# Patient Record
Sex: Female | Born: 1955 | Race: White | Hispanic: No | Smoking: Former smoker
Health system: Southern US, Community
[De-identification: ages and names within clinical notes are randomized; demographics above are authoritative.]

## PROBLEM LIST (undated history)

## (undated) DIAGNOSIS — E039 Hypothyroidism, unspecified: Secondary | ICD-10-CM

## (undated) DIAGNOSIS — C801 Malignant (primary) neoplasm, unspecified: Secondary | ICD-10-CM

## (undated) DIAGNOSIS — M1990 Unspecified osteoarthritis, unspecified site: Secondary | ICD-10-CM

## (undated) HISTORY — PX: LAPAROSCOPIC OOPHERECTOMY: SHX6507

## (undated) HISTORY — PX: BREAST SURGERY: SHX581

## (undated) HISTORY — DX: Unspecified osteoarthritis, unspecified site: M19.90

## (undated) HISTORY — DX: Malignant (primary) neoplasm, unspecified: C80.1

## (undated) HISTORY — PX: KNEE ARTHROSCOPY: SUR90

---

## 2014-08-23 ENCOUNTER — Ambulatory Visit: Payer: Self-pay | Admitting: Family Medicine

## 2014-11-29 ENCOUNTER — Ambulatory Visit (INDEPENDENT_AMBULATORY_CARE_PROVIDER_SITE_OTHER): Payer: PRIVATE HEALTH INSURANCE

## 2014-11-29 ENCOUNTER — Ambulatory Visit (INDEPENDENT_AMBULATORY_CARE_PROVIDER_SITE_OTHER): Payer: PRIVATE HEALTH INSURANCE | Admitting: Emergency Medicine

## 2014-11-29 VITALS — BP 112/72 | HR 63 | Temp 98.9°F | Resp 16 | Ht 70.25 in | Wt 161.4 lb

## 2014-11-29 DIAGNOSIS — R059 Cough, unspecified: Secondary | ICD-10-CM

## 2014-11-29 DIAGNOSIS — J209 Acute bronchitis, unspecified: Secondary | ICD-10-CM

## 2014-11-29 DIAGNOSIS — J0140 Acute pansinusitis, unspecified: Secondary | ICD-10-CM | POA: Diagnosis not present

## 2014-11-29 DIAGNOSIS — R05 Cough: Secondary | ICD-10-CM | POA: Diagnosis not present

## 2014-11-29 DIAGNOSIS — E039 Hypothyroidism, unspecified: Secondary | ICD-10-CM | POA: Insufficient documentation

## 2014-11-29 MED ORDER — HYDROCOD POLST-CPM POLST ER 10-8 MG/5ML PO SUER: 5.0000 mL | Freq: Two times a day (BID) | ORAL | Status: DC

## 2014-11-29 MED ORDER — PSEUDOEPHEDRINE-GUAIFENESIN ER 60-600 MG PO TB12: 1.0000 | ORAL_TABLET | Freq: Two times a day (BID) | ORAL | Status: DC

## 2014-11-29 NOTE — Patient Instructions (Signed)

## 2014-11-29 NOTE — Progress Notes (Signed)
Subjective:  Patient ID: Brandi Mosley, female    DOB: 1956/01/31  Age: 59 y.o. 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 She has a cough and exposed a number of clients with "walking pneumonia" she is concerned that she is contracted pneumonia as well. She has a cough that's productive of scant sputum is worse at night when she lays down. She has some nasal congestion and postnasal drainage but denies any history of sinus infection or allergies. She has no fever or chills. She has no wheezing or shortness of breath. She has no nausea or vomiting. She is requesting a chest x-ray. She has no improvement with over-the-counter medication.  History Brandi Mosley has a past medical history of Cancer and Osteoarthritis.   She has past surgical history that includes Breast surgery; Laparoscopic oopherectomy; and Knee arthroscopy (Bilateral).   Her  family history includes Cancer in her father, maternal grandmother, mother, and sister; Hyperlipidemia in her father; Hypertension in her father.  She   reports that she has never smoked. She does not have any smokeless tobacco history on file. She reports that she drinks alcohol. She reports that she does not use illicit drugs.  No outpatient prescriptions prior to visit.   No facility-administered medications prior to visit.    History   Social History  . Marital Status: Single    Spouse Name: N/A  . Number of Children: N/A  . Years of Education: N/A   Social History Main Topics  . Smoking status: Never Smoker   . Smokeless tobacco: Not on file  . Alcohol Use: 0.0 oz/week    0 Standard drinks or equivalent per week  . Drug Use: No  . Sexual Activity: Not on file   Other Topics Concern  . None   Social History Narrative  . None     Review of Systems  Constitutional: Positive for fatigue. Negative for fever,  chills and appetite change.  HENT: Positive for congestion and sinus pressure. Negative for ear pain, postnasal drip and sore throat.   Eyes: Negative for pain and redness.  Respiratory: Positive for cough. Negative for shortness of breath and wheezing.   Cardiovascular: Negative for leg swelling.  Gastrointestinal: Negative for nausea, vomiting, abdominal pain, diarrhea, constipation and blood in stool.  Endocrine: Negative for polyuria.  Genitourinary: Negative for dysuria, urgency, frequency and flank pain.  Musculoskeletal: Negative for gait problem.  Skin: Negative for rash.  Neurological: Negative for weakness and headaches.  Psychiatric/Behavioral: Negative for confusion and decreased concentration. The patient is not nervous/anxious.     Objective:  BP 112/72 mmHg  Pulse 63  Temp(Src) 98.9 F (37.2 C) (Oral)  Resp 16  Ht 5' 10.25" (1.784 m)  Wt 161 lb 6.4 oz (73.211 kg)  BMI 23.00 kg/m2  SpO2 97%  Physical Exam  Constitutional: She is oriented to person, place, and time. She appears well-developed and well-nourished. No distress.  HENT:  Head: Normocephalic and atraumatic.  Right Ear: External ear normal.  Left Ear: External ear normal.  Nose: Nose normal.  Eyes: Conjunctivae and EOM are normal. Pupils are equal, round, and reactive to light. No scleral icterus.  Neck: Normal range of motion. Neck supple. No tracheal deviation present.  Cardiovascular: Normal rate, regular rhythm and normal heart sounds.   Pulmonary/Chest: Effort normal. No respiratory distress. She has no wheezes. She has no rales.  Abdominal: She  exhibits no mass. There is no tenderness. There is no rebound and no guarding.  Musculoskeletal: She exhibits no edema.  Lymphadenopathy:    She has no cervical adenopathy.  Neurological: She is alert and oriented to person, place, and time. Coordination normal.  Skin: Skin is warm and dry. No rash noted.  Psychiatric: She has a normal mood and affect. Her  behavior is normal.      Assessment & Plan:   Brandi Mosley was seen today for chest congestion, cough, shortness of breath, fatigue and dizziness.  Diagnoses and all orders for this visit:  Acute bronchitis, unspecified organism  Hypothyroidism, unspecified hypothyroidism type  Cough Orders: -     DG Chest 2 View; Future  Acute pansinusitis, recurrence not specified  Other orders -     pseudoephedrine-guaifenesin (MUCINEX D) 60-600 MG per tablet; Take 1 tablet by mouth every 12 (twelve) hours. -     chlorpheniramine-HYDROcodone (TUSSIONEX PENNKINETIC ER) 10-8 MG/5ML SUER; Take 5 mLs by mouth 2 (two) times daily.   I am having Brandi Mosley start on pseudoephedrine-guaifenesin and chlorpheniramine-HYDROcodone. I am also having her maintain her levothyroxine.  Meds ordered this encounter  Medications  . levothyroxine (SYNTHROID, LEVOTHROID) 175 MCG tablet    Sig: Take 175 mcg by mouth daily before breakfast.  . pseudoephedrine-guaifenesin (MUCINEX D) 60-600 MG per tablet    Sig: Take 1 tablet by mouth every 12 (twelve) hours.    Dispense:  18 tablet    Refill:  0  . chlorpheniramine-HYDROcodone (TUSSIONEX PENNKINETIC ER) 10-8 MG/5ML SUER    Sig: Take 5 mLs by mouth 2 (two) times daily.    Dispense:  60 mL    Refill:  0    Appropriate red flag conditions were discussed with the patient as well as actions that should be taken.  Patient expressed his understanding.  Follow-up: Return if symptoms worsen or fail to improve.  Roselee Culver, MD   [facility] reading (PRIMARY) by  Dr. Ouida Sills.  Negative other than bilateral mastectomy.

## 2015-04-04 ENCOUNTER — Ambulatory Visit: Payer: Self-pay | Admitting: Orthopedic Surgery

## 2015-04-04 NOTE — Progress Notes (Signed)
Preoperative surgical orders have been place into the Epic hospital system for Brandi Mosley on 04/04/2015, 4:42 PM  by Mickel Crow for surgery on 04-21-2015.  Preop Total Knee orders including Experal, IV Tylenol, and IV Decadron as long as there are no contraindications to the above medications. Arlee Muslim, PA-C

## 2015-04-14 NOTE — Patient Instructions (Signed)
Tayanna Talford  04/14/2015   Your procedure is scheduled on: 04/21/2015    Report to [facility] Main  Entrance take [facility]  elevators to 3rd floor to  [place] at    1050 AM.  Call this number if you have problems the morning of surgery [phone]   Remember: ONLY 1 PERSON MAY GO WITH YOU TO SHORT STAY TO GET  READY MORNING OF [place].  Do not eat food after midnite.  May have clear liquids from 12 midnite until 0700am morning of surgery then nothing by mouth.       Take these medicines the morning of surgery with A SIP OF WATER:                                 You may not have any metal on your body including hair pins and              piercings  Do not wear jewelry, make-up, lotions, powders or perfumes, deodorant             Do not wear nail polish.  Do not shave  48 hours prior to surgery.                Do not bring valuables to the hospital. [place].  Contacts, dentures or bridgework may not be worn into surgery.  Leave suitcase in the car. After surgery it may be brought to your room.         Special Instructions: coughing and deep breathing exercises, leg exercises               Please read over the following fact sheets you were given: _____________________________________________________________________                CLEAR LIQUID DIET   Foods Allowed                                                                     Foods Excluded  Coffee and tea, regular and decaf                             liquids that you cannot  Plain Jell-O in any flavor                                             see through such as: Fruit ices (not with fruit pulp)                                     milk, soups, orange juice  Iced Popsicles  All solid food Carbonated beverages, regular and diet                                    Cranberry, grape and apple  juices Sports drinks like Gatorade Lightly seasoned clear broth or consume(fat free) Sugar, honey syrup  Sample Menu Breakfast                                Lunch                                     Supper Cranberry juice                    Beef broth                            Chicken broth Jell-O                                     Grape juice                           Apple juice Coffee or tea                        Jell-O                                      Popsicle                                                Coffee or tea                        Coffee or tea  _____________________________________________________________________  [facility] - Preparing for Surgery Before surgery, you can play an important role.  Because skin is not sterile, your skin needs to be as free of germs as possible.  You can reduce the number of germs on your skin by washing with CHG (chlorahexidine gluconate) soap before surgery.  CHG is an antiseptic cleaner which kills germs and bonds with the skin to continue killing germs even after washing. Please DO NOT use if you have an allergy to CHG or antibacterial soaps.  If your skin becomes reddened/irritated stop using the CHG and inform your nurse when you arrive at Short Stay. Do not shave (including legs and underarms) for at least 48 hours prior to the first CHG shower.  You may shave your face/neck. Please follow these instructions carefully:  1.  Shower with CHG Soap the night before surgery and the  morning of Surgery.  2.  If you choose to wash your hair, wash your hair first as usual with your  normal  shampoo.  3.  After you shampoo, rinse your hair and body thoroughly to remove the  shampoo.  4.  Use CHG as you would any other liquid soap.  You can apply chg directly  to the skin and wash                       Gently with a scrungie or clean washcloth.  5.  Apply the CHG Soap to your body ONLY FROM THE NECK DOWN.   Do not  use on face/ open                           Wound or open sores. Avoid contact with eyes, ears mouth and genitals (private parts).                       Wash face,  Genitals (private parts) with your normal soap.             6.  Wash thoroughly, paying special attention to the area where your surgery  will be performed.  7.  Thoroughly rinse your body with warm water from the neck down.  8.  DO NOT shower/wash with your normal soap after using and rinsing off  the CHG Soap.                9.  Pat yourself dry with a clean towel.            10.  Wear clean pajamas.            11.  Place clean sheets on your bed the night of your first shower and do not  sleep with pets. Day of Surgery : Do not apply any lotions/deodorants the morning of surgery.  Please wear clean clothes to the hospital/surgery center.  FAILURE TO FOLLOW THESE INSTRUCTIONS MAY RESULT IN THE CANCELLATION OF YOUR SURGERY PATIENT SIGNATURE_________________________________  NURSE SIGNATURE__________________________________  ________________________________________________________________________  WHAT IS A BLOOD TRANSFUSION? Blood Transfusion Information  A transfusion is the replacement of blood or some of its parts. Blood is made up of multiple cells which provide different functions.  Red blood cells carry oxygen and are used for blood loss replacement.  White blood cells fight against infection.  Platelets control bleeding.  Plasma helps clot blood.  Other blood products are available for specialized needs, such as hemophilia or other clotting disorders. BEFORE THE TRANSFUSION  Who gives blood for transfusions?   Healthy volunteers who are fully evaluated to make sure their blood is safe. This is blood bank blood. Transfusion therapy is the safest it has ever been in the practice of medicine. Before blood is taken from a donor, a complete history is taken to make sure that person has no history of diseases nor  engages in risky social behavior (examples are intravenous drug use or sexual activity with multiple partners). The donor's travel history is screened to minimize risk of transmitting infections, such as malaria. The donated blood is tested for signs of infectious diseases, such as HIV and hepatitis. The blood is then tested to be sure it is compatible with you in order to minimize the chance of a transfusion reaction. If you or a relative donates blood, this is often done in anticipation of surgery and is not appropriate for emergency situations. It takes many days to process the donated blood. RISKS AND COMPLICATIONS Although transfusion therapy is very safe and saves many lives, the main dangers of transfusion include:  1. Getting an infectious disease. 2. Developing a transfusion reaction.  This is an allergic reaction to something in the blood you were given. Every precaution is taken to prevent this. The decision to have a blood transfusion has been considered carefully by your caregiver before blood is given. Blood is not given unless the benefits outweigh the risks. AFTER THE TRANSFUSION  Right after receiving a blood transfusion, you will usually feel much better and more energetic. This is especially true if your red blood cells have gotten low (anemic). The transfusion raises the level of the red blood cells which carry oxygen, and this usually causes an energy increase.  The nurse administering the transfusion will monitor you carefully for complications. HOME CARE INSTRUCTIONS  No special instructions are needed after a transfusion. You may find your energy is better. Speak with your caregiver about any limitations on activity for underlying diseases you may have. SEEK MEDICAL CARE IF:   Your condition is not improving after your transfusion.  You develop redness or irritation at the intravenous (IV) site. SEEK IMMEDIATE MEDICAL CARE IF:  Any of the following symptoms occur over the  next 12 hours:  Shaking chills.  You have a temperature by mouth above 102 F (38.9 C), not controlled by medicine.  Chest, back, or muscle pain.  People around you feel you are not acting correctly or are confused.  Shortness of breath or difficulty breathing.  Dizziness and fainting.  You get a rash or develop hives.  You have a decrease in urine output.  Your urine turns a dark color or changes to pink, red, or brown. Any of the following symptoms occur over the next 10 days:  You have a temperature by mouth above 102 F (38.9 C), not controlled by medicine.  Shortness of breath.  Weakness after normal activity.  The white part of the eye turns yellow (jaundice).  You have a decrease in the amount of urine or are urinating less often.  Your urine turns a dark color or changes to pink, red, or brown. Document Released: 05/21/2000 Document Revised: 08/16/2011 Document Reviewed: 01/08/2008 ExitCare Patient Information 2014 [place].  _______________________________________________________________________  Incentive Spirometer  An incentive spirometer is a tool that can help keep your lungs clear and active. This tool measures how well you are filling your lungs with each breath. Taking long deep breaths may help reverse or decrease the chance of developing breathing (pulmonary) problems (especially infection) following:  A long period of time when you are unable to move or be active. BEFORE THE PROCEDURE   If the spirometer includes an indicator to show your best effort, your nurse or respiratory therapist will set it to a desired goal.  If possible, sit up straight or lean slightly forward. Try not to slouch.  Hold the incentive spirometer in an upright position. INSTRUCTIONS FOR USE  3. Sit on the edge of your bed if possible, or sit up as far as you can in bed or on a chair. 4. Hold the incentive spirometer in an upright position. 5. Breathe out  normally. 6. Place the mouthpiece in your mouth and seal your lips tightly around it. 7. Breathe in slowly and as deeply as possible, raising the piston or the ball toward the top of the column. 8. Hold your breath for 3-5 seconds or for as long as possible. Allow the piston or ball to fall to the bottom of the column. 9. Remove the mouthpiece from your mouth and breathe out normally. 10. Rest for a few seconds and repeat Steps  1 through 7 at least 10 times every 1-2 hours when you are awake. Take your time and take a few normal breaths between deep breaths. 11. The spirometer may include an indicator to show your best effort. Use the indicator as a goal to work toward during each repetition. 12. After each set of 10 deep breaths, practice coughing to be sure your lungs are clear. If you have an incision (the cut made at the time of surgery), support your incision when coughing by placing a pillow or rolled up towels firmly against it. Once you are able to get out of bed, walk around indoors and cough well. You may stop using the incentive spirometer when instructed by your caregiver.  RISKS AND COMPLICATIONS  Take your time so you do not get dizzy or light-headed.  If you are in pain, you may need to take or ask for pain medication before doing incentive spirometry. It is harder to take a deep breath if you are having pain. AFTER USE  Rest and breathe slowly and easily.  It can be helpful to keep track of a log of your progress. Your caregiver can provide you with a simple table to help with this. If you are using the spirometer at home, follow these instructions: [place] IF:   You are having difficultly using the spirometer.  You have trouble using the spirometer as often as instructed.  Your pain medication is not giving enough relief while using the spirometer.  You develop fever of 100.5 F (38.1 C) or higher. SEEK IMMEDIATE MEDICAL CARE IF:   You cough up bloody sputum  that had not been present before.  You develop fever of 102 F (38.9 C) or greater.  You develop worsening pain at or near the incision site. MAKE SURE YOU:   Understand these instructions.  Will watch your condition.  Will get help right away if you are not doing well or get worse. Document Released: 10/04/2006 Document Revised: 08/16/2011 Document Reviewed: 12/05/2006 [facility] Patient Information 2014 [place], [place].   ________________________________________________________________________

## 2015-04-15 ENCOUNTER — Encounter (HOSPITAL_COMMUNITY)
Admission: RE | Admit: 2015-04-15 | Discharge: 2015-04-15 | Disposition: A | Discharge status: Home / Self Care | Payer: No Typology Code available for payment source | Source: Ambulatory Visit | Attending: Orthopedic Surgery | Admitting: Orthopedic Surgery

## 2015-04-15 ENCOUNTER — Encounter (HOSPITAL_COMMUNITY): Payer: Self-pay

## 2015-04-15 DIAGNOSIS — M179 Osteoarthritis of knee, unspecified: Secondary | ICD-10-CM | POA: Insufficient documentation

## 2015-04-15 DIAGNOSIS — Z01818 Encounter for other preprocedural examination: Secondary | ICD-10-CM | POA: Insufficient documentation

## 2015-04-15 HISTORY — DX: Hypothyroidism, unspecified: E03.9

## 2015-04-15 LAB — COMPREHENSIVE METABOLIC PANEL
ALT: 24 U/L (ref 14–54)
AST: 30 U/L (ref 15–41)
Albumin: 4.6 g/dL (ref 3.5–5.0)
Alkaline Phosphatase: 52 U/L (ref 38–126)
Anion gap: 8 (ref 5–15)
BILIRUBIN TOTAL: 0.7 mg/dL (ref 0.3–1.2)
BUN: 16 mg/dL (ref 6–20)
CALCIUM: 9.7 mg/dL (ref 8.9–10.3)
CHLORIDE: 101 mmol/L (ref 101–111)
CO2: 30 mmol/L (ref 22–32)
CREATININE: 0.78 mg/dL (ref 0.44–1.00)
Glucose, Bld: 117 mg/dL — ABNORMAL HIGH (ref 65–99)
Potassium: 4.1 mmol/L (ref 3.5–5.1)
Sodium: 139 mmol/L (ref 135–145)
TOTAL PROTEIN: 7.2 g/dL (ref 6.5–8.1)

## 2015-04-15 LAB — URINALYSIS, ROUTINE W REFLEX MICROSCOPIC
Bilirubin Urine: NEGATIVE
Glucose, UA: NEGATIVE mg/dL
Hgb urine dipstick: NEGATIVE
Ketones, ur: NEGATIVE mg/dL
LEUKOCYTES UA: NEGATIVE
NITRITE: NEGATIVE
PH: 7 (ref 5.0–8.0)
Protein, ur: NEGATIVE mg/dL
SPECIFIC GRAVITY, URINE: 1.008 (ref 1.005–1.030)
UROBILINOGEN UA: 0.2 mg/dL (ref 0.0–1.0)

## 2015-04-15 LAB — CBC
HCT: 43.7 % (ref 36.0–46.0)
Hemoglobin: 14.3 g/dL (ref 12.0–15.0)
MCH: 31.8 pg (ref 26.0–34.0)
MCHC: 32.7 g/dL (ref 30.0–36.0)
MCV: 97.3 fL (ref 78.0–100.0)
PLATELETS: 299 10*3/uL (ref 150–400)
RBC: 4.49 MIL/uL (ref 3.87–5.11)
RDW: 12.9 % (ref 11.5–15.5)
WBC: 6.3 10*3/uL (ref 4.0–10.5)

## 2015-04-15 LAB — SURGICAL PCR SCREEN
MRSA, PCR: NEGATIVE
STAPHYLOCOCCUS AUREUS: NEGATIVE

## 2015-04-15 LAB — APTT: APTT: 27 s (ref 24–37)

## 2015-04-15 LAB — ABO/RH: ABO/RH(D): A NEG

## 2015-04-15 LAB — PROTIME-INR
INR: 1 (ref 0.00–1.49)
PROTHROMBIN TIME: 13.4 s (ref 11.6–15.2)

## 2015-04-15 NOTE — Progress Notes (Signed)
Clearance- Dr Kelton Pillar 04/07/2015 on chart along with LOV.  CXR- 11/29/2014-EPIC.

## 2015-04-20 ENCOUNTER — Ambulatory Visit: Payer: Self-pay | Admitting: Orthopedic Surgery

## 2015-04-20 NOTE — H&P (Signed)
Brandi Dine MD DOB: Aug 30, 1955 Divorced / Language: Cleophus Molt / Race: White Female Date of Admission:  04/21/2015 CC:  Right Knee Pain History of Present Illness  The patient is a 59 year old female who comes in for a preoperative History and Physical. The patient is scheduled for a right total knee arthroplasty to be performed by Dr. Dione Plover. Aluisio, MD at [facility] on 04-21-2015. The patient reports right knee symptoms including: pain (lateral side pain), instability, giving way and popping (with warmth and tenderness.) which began year(s) ago without any known injury. The patient describes the severity of the symptoms as moderate in severity. The patient describes their pain as sharp.The patient feels that the symptoms are worsening. The patient has the current diagnosis of knee osteoarthritis. Previous work-up for this problem has included knee x-rays and knee MRI. Past treatment for this problem has included intra-articular injection of corticosteroids. Current treatment includes application of ice and knee brace. Note for "Knee pain": 1976 pt. had bilateral knee scope. Pt. does not believe in pain medicine Unfortunately, the right knee is getting progressively worse over time. She is currently working as a Transport planner, helping veterans. She says it is getting hard for her to do her occupational activities as well as activities of daily living. She was scheduled for total knee with Dr. Noemi Chapel later in November, but per conversations with many friends has requested for me to treat her. She was seen in consultation and felt to be a good candidate for the knee replacement. The knee hurts it at all times, it limits her what she can and cannot do. She has had injections in the past without benefit. Sje has elected to proceed with surgery at this time. They have been treated conservatively in the past for the above stated problem and despite conservative measures, they continue to have  progressive pain and severe functional limitations and dysfunction. They have failed non-operative management including home exercise, medications, and injections. It is felt that they would benefit from undergoing total joint replacement. Risks and benefits of the procedure have been discussed with the patient and they elect to proceed with surgery. There are no active contraindications to surgery such as ongoing infection or rapidly progressive neurological disease.  Problem List/Past Medical Primary osteoarthritis of right knee (M17.11) Hypothyroidism Chronic Pain Osteoarthritis Breast Cancer Hemorrhoids Shingles Past History  Allergies No Known Drug Allergies  Family History Cancer Father, Maternal Grandmother, Mother, Sister. Osteoarthritis Father, Sister. Hypertension Father. Osteoporosis Father, Sister. Depression Maternal Grandmother, Mother, Sister. Cerebrovascular Accident Mother. Heart Disease Father. Drug / Alcohol Addiction Brother, Father.  Social History Current drinker 04/03/2015: Currently drinks beer and wine less than 5 times per week Children 0 Exercise Exercises weekly; does running / walking Current work status working full time Living situation live alone Tobacco use Former smoker. 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 She lives alone and will see how she does following total knee replacement.  Medication History Synthroid (175MCG Tablet, Oral) Active.   Past Surgical History Mastectomy - Bilateral Date: 2008. bilateral Hemorrhoidectomy Tonsillectomy Date: 43. Arthroscopy of Knee Date: 1978. bilateral Anal Fissure Repair Dilation and  Curettage of Uterus - Multiple Breast Biopsy right Oophorectomy, Bilateral Date: 2009.  Review of Systems  General Present- Fatigue. Not Present-  Chills, Fever, Memory Loss, Night Sweats, Weight Gain and Weight Loss. Skin Not Present- Eczema, Hives, Itching, Lesions and Rash. HEENT Not Present- Dentures, Double Vision, Headache, Hearing Loss, Tinnitus and Visual Loss. Respiratory Not Present- Allergies, Chronic Cough, Coughing up blood, Shortness of breath at rest and Shortness of breath with exertion. Cardiovascular Not Present- Chest Pain, Difficulty Breathing Lying Down, Murmur, Palpitations, Racing/skipping heartbeats and Swelling. Gastrointestinal Not Present- Abdominal Pain, Bloody Stool, Constipation, Diarrhea, Difficulty Swallowing, Heartburn, Jaundice, Loss of appetitie, Nausea and Vomiting. Female Genitourinary Not Present- Blood in Urine, Discharge, Flank Pain, Incontinence, Painful Urination, Urgency, Urinary frequency, Urinary Retention, Urinating at Night and Weak urinary stream. Musculoskeletal Present- Back Pain, Joint Pain and Joint Swelling. Not Present- Morning Stiffness, Muscle Pain, Muscle Weakness and Spasms. Neurological Not Present- Blackout spells, Difficulty with balance, Dizziness, Paralysis, Tremor and Weakness. Psychiatric Not Present- Insomnia.  Vitals  Weight: 156 lb Height: 70.5in Weight was reported by patient. Height was reported by patient. Body Surface Area: 1.89 m Body Mass Index: 22.07 kg/m  BP: 118/80 (Sitting, Left Arm, Standard)   Physical Exam General Mental Status -Alert, cooperative and good historian. General Appearance-pleasant, Not in acute distress. Orientation-Oriented X3. Build & Nutrition-Well nourished and Well developed.  Head and Neck Head-normocephalic, atraumatic . Neck Global Assessment - supple, no bruit auscultated on the right, no bruit auscultated on the left.  Eye Pupil - Bilateral-Regular and  Round. Motion - Bilateral-EOMI.  Chest and Lung Exam Auscultation Breath sounds - clear at anterior chest wall and clear at posterior chest wall. Adventitious sounds - No Adventitious sounds.  Cardiovascular Auscultation Rhythm - Regular rate and rhythm. Heart Sounds - S1 WNL and S2 WNL. Murmurs & Other Heart Sounds - Auscultation of the heart reveals - No Murmurs.  Abdomen Palpation/Percussion Tenderness - Abdomen is non-tender to palpation. Rigidity (guarding) - Abdomen is soft. Auscultation Auscultation of the abdomen reveals - Bowel sounds normal.  Female Genitourinary Note: Not done, not pertinent to present illness   Musculoskeletal Note: On exam, well-developed female, alert and oriented, in no apparent distress. Evaluation of her hips normal range of motion, no discomfort. Left knee, no effusion. Range of motion of the left knee 0 to 135. There is moderate crepitus with range of motion of the left knee with some tenderness, medial greater than lateral. No instability noted. Her right knee shows no effusion. Her range is about 5 to 125. There is marked crepitus on range of motion of the right knee. There is diffuse tenderness about the right knee. There is no instability noted. Pulse, sensation and motor are intact.  IMAGING Radiographs, AP of both knees and lateral show advanced endstage arthritic change in the patellofemoral compartment as well as medial and lateral narrowing on the right knee. Left knee shows arthritic change also, but to a lesser degree.  Assessment & Plan Primary osteoarthritis of right knee (M17.11) Note:Surgical Plans: Right Total Knee Replacement  Disposition: Rehab versus home. She lives alone.  PCP: Dr. Kelton Pillar - Patient has been seen preoperatively and felt to be stable for surgery.  Topical TXA  Anesthesia Issues: None  Signed electronically by Joelene Millin, III PA-C

## 2015-04-21 ENCOUNTER — Inpatient Hospital Stay (HOSPITAL_COMMUNITY)
Admission: RE | Admit: 2015-04-21 | Discharge: 2015-04-23 | DRG: 470 | Disposition: A | Discharge status: Skilled Nursing Facility | Payer: No Typology Code available for payment source | Source: Ambulatory Visit | Attending: Orthopedic Surgery | Admitting: Orthopedic Surgery

## 2015-04-21 ENCOUNTER — Encounter (HOSPITAL_COMMUNITY)
Admission: RE | Disposition: A | Discharge status: Skilled Nursing Facility | Payer: Self-pay | Source: Ambulatory Visit | Attending: Orthopedic Surgery

## 2015-04-21 ENCOUNTER — Encounter (HOSPITAL_COMMUNITY): Payer: Self-pay | Admitting: *Deleted

## 2015-04-21 ENCOUNTER — Inpatient Hospital Stay (HOSPITAL_COMMUNITY): Payer: No Typology Code available for payment source | Admitting: Registered Nurse

## 2015-04-21 DIAGNOSIS — Z853 Personal history of malignant neoplasm of breast: Secondary | ICD-10-CM

## 2015-04-21 DIAGNOSIS — M179 Osteoarthritis of knee, unspecified: Secondary | ICD-10-CM | POA: Diagnosis present

## 2015-04-21 DIAGNOSIS — Z87891 Personal history of nicotine dependence: Secondary | ICD-10-CM

## 2015-04-21 DIAGNOSIS — Z01812 Encounter for preprocedural laboratory examination: Secondary | ICD-10-CM

## 2015-04-21 DIAGNOSIS — M1711 Unilateral primary osteoarthritis, right knee: Principal | ICD-10-CM | POA: Diagnosis present

## 2015-04-21 DIAGNOSIS — M25561 Pain in right knee: Secondary | ICD-10-CM | POA: Diagnosis present

## 2015-04-21 DIAGNOSIS — E039 Hypothyroidism, unspecified: Secondary | ICD-10-CM | POA: Diagnosis present

## 2015-04-21 DIAGNOSIS — M1710 Unilateral primary osteoarthritis, unspecified knee: Secondary | ICD-10-CM | POA: Diagnosis present

## 2015-04-21 HISTORY — PX: TOTAL KNEE ARTHROPLASTY: SHX125

## 2015-04-21 LAB — TYPE AND SCREEN
ABO/RH(D): A NEG
Antibody Screen: NEGATIVE

## 2015-04-21 SURGERY — ARTHROPLASTY, KNEE, TOTAL
Anesthesia: Spinal | Site: Knee | Laterality: Right

## 2015-04-21 MED ORDER — PROPOFOL 10 MG/ML IV BOLUS
INTRAVENOUS | Status: AC
  Filled 2015-04-21: qty 20

## 2015-04-21 MED ORDER — TRANEXAMIC ACID 1000 MG/10ML IV SOLN
2000.0000 mg | Freq: Once | INTRAVENOUS | Status: DC
  Filled 2015-04-21: qty 20

## 2015-04-21 MED ORDER — MIDAZOLAM HCL 2 MG/2ML IJ SOLN
INTRAMUSCULAR | Status: AC
  Filled 2015-04-21: qty 2

## 2015-04-21 MED ORDER — METOCLOPRAMIDE HCL 5 MG/ML IJ SOLN: 5.0000 mg | Freq: Three times a day (TID) | INTRAMUSCULAR | Status: DC | PRN

## 2015-04-21 MED ORDER — BUPIVACAINE IN DEXTROSE 0.75-8.25 % IT SOLN
INTRATHECAL | Status: DC | PRN
  Administered 2015-04-21: 1.6 mL via INTRATHECAL

## 2015-04-21 MED ORDER — PROMETHAZINE HCL 25 MG/ML IJ SOLN: 6.2500 mg | INTRAMUSCULAR | Status: DC | PRN

## 2015-04-21 MED ORDER — BUPIVACAINE LIPOSOME 1.3 % IJ SUSP
INTRAMUSCULAR | Status: DC | PRN
  Administered 2015-04-21: 20 mL

## 2015-04-21 MED ORDER — LEVOTHYROXINE SODIUM 175 MCG PO TABS
175.0000 ug | ORAL_TABLET | Freq: Every day | ORAL | Status: DC
  Administered 2015-04-22 – 2015-04-23 (×2): 175 ug via ORAL
  Filled 2015-04-21 (×3): qty 1

## 2015-04-21 MED ORDER — SODIUM CHLORIDE 0.9 % IV SOLN
INTRAVENOUS | Status: DC
  Administered 2015-04-21: 19:00:00 via INTRAVENOUS

## 2015-04-21 MED ORDER — DEXAMETHASONE SODIUM PHOSPHATE 10 MG/ML IJ SOLN
INTRAMUSCULAR | Status: AC
  Filled 2015-04-21: qty 1

## 2015-04-21 MED ORDER — OXYCODONE HCL 5 MG PO TABS
5.0000 mg | ORAL_TABLET | ORAL | Status: DC | PRN
  Administered 2015-04-22: 5 mg via ORAL
  Administered 2015-04-23 (×3): 10 mg via ORAL
  Filled 2015-04-21 (×2): qty 2
  Filled 2015-04-21: qty 1
  Filled 2015-04-21: qty 2
  Filled 2015-04-21: qty 1

## 2015-04-21 MED ORDER — CEFAZOLIN SODIUM-DEXTROSE 2-3 GM-% IV SOLR
2.0000 g | Freq: Four times a day (QID) | INTRAVENOUS | Status: AC
  Administered 2015-04-21 – 2015-04-22 (×2): 2 g via INTRAVENOUS
  Filled 2015-04-21 (×2): qty 50

## 2015-04-21 MED ORDER — ONDANSETRON HCL 4 MG PO TABS: 4.0000 mg | ORAL_TABLET | Freq: Four times a day (QID) | ORAL | Status: DC | PRN

## 2015-04-21 MED ORDER — SODIUM CHLORIDE 0.9 % IR SOLN
Status: DC | PRN
  Administered 2015-04-21: 1000 mL

## 2015-04-21 MED ORDER — PHENOL 1.4 % MT LIQD
1.0000 | OROMUCOSAL | Status: DC | PRN
Start: 2015-04-21 — End: 2015-04-23

## 2015-04-21 MED ORDER — PHENYLEPHRINE HCL 10 MG/ML IJ SOLN
INTRAMUSCULAR | Status: DC | PRN
  Administered 2015-04-21: 80 ug via INTRAVENOUS
  Administered 2015-04-21: 40 ug via INTRAVENOUS

## 2015-04-21 MED ORDER — BISACODYL 10 MG RE SUPP
10.0000 mg | Freq: Every day | RECTAL | Status: DC | PRN
  Filled 2015-04-21: qty 1

## 2015-04-21 MED ORDER — BUPIVACAINE HCL 0.25 % IJ SOLN
INTRAMUSCULAR | Status: DC | PRN
  Administered 2015-04-21: 20 mL

## 2015-04-21 MED ORDER — HYDROMORPHONE HCL 1 MG/ML IJ SOLN
INTRAMUSCULAR | Status: AC
  Filled 2015-04-21: qty 1

## 2015-04-21 MED ORDER — TRAMADOL HCL 50 MG PO TABS
50.0000 mg | ORAL_TABLET | Freq: Four times a day (QID) | ORAL | Status: DC | PRN
  Administered 2015-04-22: 100 mg via ORAL
  Administered 2015-04-22 (×3): 50 mg via ORAL
  Administered 2015-04-23: 100 mg via ORAL
  Filled 2015-04-21: qty 2
  Filled 2015-04-21 (×4): qty 1
  Filled 2015-04-21: qty 2

## 2015-04-21 MED ORDER — KETOROLAC TROMETHAMINE 15 MG/ML IJ SOLN
7.5000 mg | Freq: Four times a day (QID) | INTRAMUSCULAR | Status: AC | PRN
  Administered 2015-04-21: 7.5 mg via INTRAVENOUS
  Filled 2015-04-21: qty 1

## 2015-04-21 MED ORDER — FENTANYL CITRATE (PF) 100 MCG/2ML IJ SOLN: 25.0000 ug | INTRAMUSCULAR | Status: DC | PRN

## 2015-04-21 MED ORDER — DIPHENHYDRAMINE HCL 12.5 MG/5ML PO ELIX: 12.5000 mg | ORAL_SOLUTION | ORAL | Status: DC | PRN

## 2015-04-21 MED ORDER — DEXAMETHASONE SODIUM PHOSPHATE 10 MG/ML IJ SOLN
10.0000 mg | Freq: Once | INTRAMUSCULAR | Status: AC
  Administered 2015-04-22: 10 mg via INTRAVENOUS
  Filled 2015-04-21 (×2): qty 1

## 2015-04-21 MED ORDER — HYDROMORPHONE HCL 1 MG/ML IJ SOLN
0.2500 mg | INTRAMUSCULAR | Status: DC | PRN
  Administered 2015-04-21 (×4): 0.5 mg via INTRAVENOUS

## 2015-04-21 MED ORDER — ONDANSETRON HCL 4 MG/2ML IJ SOLN
INTRAMUSCULAR | Status: DC | PRN
  Administered 2015-04-21: 4 mg via INTRAVENOUS

## 2015-04-21 MED ORDER — SODIUM CHLORIDE 0.9 % IJ SOLN
INTRAMUSCULAR | Status: DC | PRN
  Administered 2015-04-21: 30 mL

## 2015-04-21 MED ORDER — FENTANYL CITRATE (PF) 100 MCG/2ML IJ SOLN
INTRAMUSCULAR | Status: DC | PRN
  Administered 2015-04-21: 50 ug via INTRAVENOUS

## 2015-04-21 MED ORDER — MIDAZOLAM HCL 5 MG/5ML IJ SOLN
INTRAMUSCULAR | Status: DC | PRN
  Administered 2015-04-21: 2 mg via INTRAVENOUS

## 2015-04-21 MED ORDER — LACTATED RINGERS IV SOLN
INTRAVENOUS | Status: DC
Start: 2015-04-21 — End: 2015-04-21
  Administered 2015-04-21 (×2): via INTRAVENOUS
  Administered 2015-04-21: 1000 mL via INTRAVENOUS

## 2015-04-21 MED ORDER — ACETAMINOPHEN 500 MG PO TABS
1000.0000 mg | ORAL_TABLET | Freq: Four times a day (QID) | ORAL | Status: AC
  Administered 2015-04-22 (×3): 1000 mg via ORAL
  Filled 2015-04-21 (×4): qty 2

## 2015-04-21 MED ORDER — STERILE WATER FOR IRRIGATION IR SOLN
Status: DC | PRN
  Administered 2015-04-21: 1500 mL

## 2015-04-21 MED ORDER — ACETAMINOPHEN 10 MG/ML IV SOLN
1000.0000 mg | Freq: Once | INTRAVENOUS | Status: AC
  Administered 2015-04-21: 1000 mg via INTRAVENOUS

## 2015-04-21 MED ORDER — SODIUM CHLORIDE 0.9 % IV SOLN: INTRAVENOUS | Status: DC

## 2015-04-21 MED ORDER — MEPERIDINE HCL 50 MG/ML IJ SOLN
INTRAMUSCULAR | Status: AC
  Filled 2015-04-21: qty 1

## 2015-04-21 MED ORDER — 0.9 % SODIUM CHLORIDE (POUR BTL) OPTIME
TOPICAL | Status: DC | PRN
  Administered 2015-04-21: 1000 mL

## 2015-04-21 MED ORDER — LIDOCAINE HCL (CARDIAC) 20 MG/ML IV SOLN
INTRAVENOUS | Status: AC
  Filled 2015-04-21: qty 5

## 2015-04-21 MED ORDER — MORPHINE SULFATE (PF) 2 MG/ML IV SOLN: 1.0000 mg | INTRAVENOUS | Status: DC | PRN

## 2015-04-21 MED ORDER — CEFAZOLIN SODIUM-DEXTROSE 2-3 GM-% IV SOLR
2.0000 g | INTRAVENOUS | Status: AC
  Administered 2015-04-21: 2 g via INTRAVENOUS

## 2015-04-21 MED ORDER — RIVAROXABAN 10 MG PO TABS
10.0000 mg | ORAL_TABLET | Freq: Every day | ORAL | Status: DC
  Administered 2015-04-22 – 2015-04-23 (×2): 10 mg via ORAL
  Filled 2015-04-21 (×3): qty 1

## 2015-04-21 MED ORDER — CHLORHEXIDINE GLUCONATE 4 % EX LIQD: 60.0000 mL | Freq: Once | CUTANEOUS | Status: DC

## 2015-04-21 MED ORDER — LACTATED RINGERS IV SOLN: INTRAVENOUS | Status: DC

## 2015-04-21 MED ORDER — FENTANYL CITRATE (PF) 100 MCG/2ML IJ SOLN
INTRAMUSCULAR | Status: AC
  Filled 2015-04-21: qty 2

## 2015-04-21 MED ORDER — METOCLOPRAMIDE HCL 10 MG PO TABS: 5.0000 mg | ORAL_TABLET | Freq: Three times a day (TID) | ORAL | Status: DC | PRN

## 2015-04-21 MED ORDER — METHOCARBAMOL 500 MG PO TABS
500.0000 mg | ORAL_TABLET | Freq: Four times a day (QID) | ORAL | Status: DC | PRN
  Administered 2015-04-21 – 2015-04-23 (×3): 500 mg via ORAL
  Filled 2015-04-21 (×4): qty 1

## 2015-04-21 MED ORDER — PHENYLEPHRINE 40 MCG/ML (10ML) SYRINGE FOR IV PUSH (FOR BLOOD PRESSURE SUPPORT)
PREFILLED_SYRINGE | INTRAVENOUS | Status: AC
  Filled 2015-04-21: qty 10

## 2015-04-21 MED ORDER — MEPERIDINE HCL 50 MG/ML IJ SOLN
6.2500 mg | INTRAMUSCULAR | Status: DC | PRN
  Administered 2015-04-21: 12.5 mg via INTRAVENOUS
  Administered 2015-04-21: 6.25 mg via INTRAVENOUS

## 2015-04-21 MED ORDER — ACETAMINOPHEN 325 MG PO TABS
650.0000 mg | ORAL_TABLET | Freq: Four times a day (QID) | ORAL | Status: DC | PRN
  Administered 2015-04-23: 650 mg via ORAL
  Filled 2015-04-21: qty 2

## 2015-04-21 MED ORDER — METHOCARBAMOL 1000 MG/10ML IJ SOLN
500.0000 mg | Freq: Four times a day (QID) | INTRAVENOUS | Status: DC | PRN
  Administered 2015-04-21: 500 mg via INTRAVENOUS
  Filled 2015-04-21 (×2): qty 5

## 2015-04-21 MED ORDER — ACETAMINOPHEN 650 MG RE SUPP: 650.0000 mg | Freq: Four times a day (QID) | RECTAL | Status: DC | PRN

## 2015-04-21 MED ORDER — PROPOFOL 500 MG/50ML IV EMUL
INTRAVENOUS | Status: DC | PRN
  Administered 2015-04-21: 30 ug/kg/min via INTRAVENOUS

## 2015-04-21 MED ORDER — BUPIVACAINE LIPOSOME 1.3 % IJ SUSP
20.0000 mL | Freq: Once | INTRAMUSCULAR | Status: DC
  Filled 2015-04-21: qty 20

## 2015-04-21 MED ORDER — SODIUM CHLORIDE 0.9 % IJ SOLN
INTRAMUSCULAR | Status: AC
  Filled 2015-04-21: qty 50

## 2015-04-21 MED ORDER — ACETAMINOPHEN 10 MG/ML IV SOLN
INTRAVENOUS | Status: AC
Start: 2015-04-21 — End: 2015-04-21
  Filled 2015-04-21: qty 100

## 2015-04-21 MED ORDER — DEXAMETHASONE SODIUM PHOSPHATE 10 MG/ML IJ SOLN
10.0000 mg | Freq: Once | INTRAMUSCULAR | Status: AC
  Administered 2015-04-21: 10 mg via INTRAVENOUS

## 2015-04-21 MED ORDER — LIDOCAINE HCL (CARDIAC) 20 MG/ML IV SOLN
INTRAVENOUS | Status: DC | PRN
  Administered 2015-04-21: 100 mg via INTRAVENOUS

## 2015-04-21 MED ORDER — FLEET ENEMA 7-19 GM/118ML RE ENEM: 1.0000 | ENEMA | Freq: Once | RECTAL | Status: DC | PRN

## 2015-04-21 MED ORDER — BUPIVACAINE HCL (PF) 0.25 % IJ SOLN
INTRAMUSCULAR | Status: AC
  Filled 2015-04-21: qty 30

## 2015-04-21 MED ORDER — TRANEXAMIC ACID 1000 MG/10ML IV SOLN
2000.0000 mg | INTRAVENOUS | Status: DC | PRN
  Administered 2015-04-21: 2000 mg via TOPICAL

## 2015-04-21 MED ORDER — DOCUSATE SODIUM 100 MG PO CAPS
100.0000 mg | ORAL_CAPSULE | Freq: Two times a day (BID) | ORAL | Status: DC
  Administered 2015-04-22 – 2015-04-23 (×3): 100 mg via ORAL

## 2015-04-21 MED ORDER — ONDANSETRON HCL 4 MG/2ML IJ SOLN: 4.0000 mg | Freq: Four times a day (QID) | INTRAMUSCULAR | Status: DC | PRN

## 2015-04-21 MED ORDER — MENTHOL 3 MG MT LOZG: 1.0000 | LOZENGE | OROMUCOSAL | Status: DC | PRN

## 2015-04-21 MED ORDER — EPHEDRINE SULFATE 50 MG/ML IJ SOLN
INTRAMUSCULAR | Status: DC | PRN
  Administered 2015-04-21 (×3): 5 mg via INTRAVENOUS

## 2015-04-21 MED ORDER — POLYETHYLENE GLYCOL 3350 17 G PO PACK: 17.0000 g | PACK | Freq: Every day | ORAL | Status: DC | PRN

## 2015-04-21 MED ORDER — CEFAZOLIN SODIUM-DEXTROSE 2-3 GM-% IV SOLR
INTRAVENOUS | Status: AC
  Filled 2015-04-21: qty 50

## 2015-04-21 SURGICAL SUPPLY — 49 items
BAG DECANTER FOR FLEXI CONT (MISCELLANEOUS) ×3 IMPLANT
BAG ZIPLOCK 12X15 (MISCELLANEOUS) ×3 IMPLANT
BANDAGE ELASTIC 6 VELCRO ST LF (GAUZE/BANDAGES/DRESSINGS) ×3 IMPLANT
BLADE SAG 18X100X1.27 (BLADE) ×3 IMPLANT
BLADE SAW SGTL 11.0X1.19X90.0M (BLADE) ×3 IMPLANT
BOWL SMART MIX CTS (DISPOSABLE) ×3 IMPLANT
CAPT KNEE TOTAL 3 ATTUNE ×3 IMPLANT
CEMENT HV SMART SET (Cement) ×6 IMPLANT
CLOSURE WOUND 1/2 X4 (GAUZE/BANDAGES/DRESSINGS) ×1
CLOTH BEACON ORANGE TIMEOUT ST (SAFETY) ×3 IMPLANT
CUFF TOURN SGL QUICK 34 (TOURNIQUET CUFF) ×2
CUFF TRNQT CYL 34X4X40X1 (TOURNIQUET CUFF) ×1 IMPLANT
DECANTER SPIKE VIAL GLASS SM (MISCELLANEOUS) ×3 IMPLANT
DRAPE U-SHAPE 47X51 STRL (DRAPES) ×3 IMPLANT
DRSG ADAPTIC 3X8 NADH LF (GAUZE/BANDAGES/DRESSINGS) ×3 IMPLANT
DRSG PAD ABDOMINAL 8X10 ST (GAUZE/BANDAGES/DRESSINGS) ×3 IMPLANT
DURAPREP 26ML APPLICATOR (WOUND CARE) ×3 IMPLANT
ELECT REM PT RETURN 9FT ADLT (ELECTROSURGICAL) ×3
ELECTRODE REM PT RTRN 9FT ADLT (ELECTROSURGICAL) ×1 IMPLANT
EVACUATOR 1/8 PVC DRAIN (DRAIN) ×3 IMPLANT
GAUZE SPONGE 4X4 12PLY STRL (GAUZE/BANDAGES/DRESSINGS) ×3 IMPLANT
GLOVE BIO SURGEON STRL SZ7.5 (GLOVE) IMPLANT
GLOVE BIO SURGEON STRL SZ8 (GLOVE) ×3 IMPLANT
GLOVE BIOGEL PI IND STRL 6.5 (GLOVE) IMPLANT
GLOVE BIOGEL PI IND STRL 8 (GLOVE) ×1 IMPLANT
GLOVE BIOGEL PI INDICATOR 6.5 (GLOVE)
GLOVE BIOGEL PI INDICATOR 8 (GLOVE) ×2
GLOVE SURG SS PI 6.5 STRL IVOR (GLOVE) IMPLANT
GOWN STRL REUS W/TWL LRG LVL3 (GOWN DISPOSABLE) ×3 IMPLANT
GOWN STRL REUS W/TWL XL LVL3 (GOWN DISPOSABLE) IMPLANT
HANDPIECE INTERPULSE COAX TIP (DISPOSABLE) ×2
IMMOBILIZER KNEE 20 (SOFTGOODS) ×3
IMMOBILIZER KNEE 20 THIGH 36 (SOFTGOODS) ×1 IMPLANT
MANIFOLD NEPTUNE II (INSTRUMENTS) ×3 IMPLANT
NS IRRIG 1000ML POUR BTL (IV SOLUTION) ×3 IMPLANT
PACK TOTAL KNEE CUSTOM (KITS) ×3 IMPLANT
PADDING CAST COTTON 6X4 STRL (CAST SUPPLIES) ×3 IMPLANT
POSITIONER SURGICAL ARM (MISCELLANEOUS) ×3 IMPLANT
SET HNDPC FAN SPRY TIP SCT (DISPOSABLE) ×1 IMPLANT
STRIP CLOSURE SKIN 1/2X4 (GAUZE/BANDAGES/DRESSINGS) ×2 IMPLANT
SUT MNCRL AB 4-0 PS2 18 (SUTURE) ×3 IMPLANT
SUT VIC AB 2-0 CT1 27 (SUTURE) ×6
SUT VIC AB 2-0 CT1 TAPERPNT 27 (SUTURE) ×3 IMPLANT
SUT VLOC 180 0 24IN GS25 (SUTURE) ×3 IMPLANT
SYR 50ML LL SCALE MARK (SYRINGE) ×3 IMPLANT
TRAY FOLEY W/METER SILVER 14FR (SET/KITS/TRAYS/PACK) ×3 IMPLANT
WATER STERILE IRR 1500ML POUR (IV SOLUTION) ×3 IMPLANT
WRAP KNEE MAXI GEL POST OP (GAUZE/BANDAGES/DRESSINGS) ×3 IMPLANT
YANKAUER SUCT BULB TIP 10FT TU (MISCELLANEOUS) ×3 IMPLANT

## 2015-04-21 NOTE — Transfer of Care (Signed)
Immediate Anesthesia Transfer of Care Note  Patient: Brandi Mosley  Procedure(s) Performed: Procedure(s): TOTAL KNEE ARTHROPLASTY (Right)  Patient Location: PACU  Anesthesia Type:MAC and Spinal  Level of Consciousness: awake, alert  and oriented  Airway & Oxygen Therapy: Patient Spontanous Breathing and Patient connected to face mask oxygen  Post-op Assessment: Report given to RN and Post -op Vital signs reviewed and stable  Post vital signs: Reviewed and stable  Last Vitals:  Filed Vitals:   04/21/15 1050  BP: 101/73  Pulse: 101  Temp: 36.7 C  Resp: 16    Complications: No apparent anesthesia complications

## 2015-04-21 NOTE — Op Note (Signed)
Pre-operative diagnosis- Osteoarthritis  Right knee(s)  Post-operative diagnosis- Osteoarthritis Right knee(s)  Procedure-  Right  Total Knee Arthroplasty  Surgeon- Dione Plover. Solana Coggin, MD  Assistant- Arlee Muslim, PA-C   Anesthesia-  Spinal  EBL-* No blood loss amount entered *   Drains Hemovac  Tourniquet time-  Total Tourniquet Time Documented: Thigh (Right) - 33 minutes Total: Thigh (Right) - 33 minutes     Complications- None  Condition-PACU - hemodynamically stable.   Brief Clinical Note  Brandi Mosley is a 59 y.o. year old female with end stage OA of her right knee with progressively worsening pain and dysfunction. She has constant pain, with activity and at rest and significant functional deficits with difficulties even with ADLs. She has had extensive non-op management including analgesics, injections of cortisone, and home exercise program, but remains in significant pain with significant dysfunction.Radiographs show bone on bone arthritis patellofemoral with patellar erosion. She presents now for right Total Knee Arthroplasty.    Procedure in detail---   The patient is brought into the operating room and positioned supine on the operating table. After successful administration of  Spinal,   a tourniquet is placed high on the  Right thigh(s) and the lower extremity is prepped and draped in the usual sterile fashion. Time out is performed by the operating team and then the  Right lower extremity is wrapped in Esmarch, knee flexed and the tourniquet inflated to 300 mmHg.       A midline incision is made with a ten blade through the subcutaneous tissue to the level of the extensor mechanism. A fresh blade is used to make a medial parapatellar arthrotomy. Soft tissue over the proximal medial tibia is subperiosteally elevated to the joint line with a knife and into the semimembranosus bursa with a Cobb elevator. Soft tissue over the proximal lateral tibia is elevated with attention  being paid to avoiding the patellar tendon on the tibial tubercle. The patella is everted, knee flexed 90 degrees and the ACL and PCL are removed. Findings are bone on bone patellofemoral with exposed bone also in medial and lateral compartments.        The drill is used to create a starting hole in the distal femur and the canal is thoroughly irrigated with sterile saline to remove the fatty contents. The 5 degree Right  valgus alignment guide is placed into the femoral canal and the distal femoral cutting block is pinned to remove 10 mm off the distal femur. Resection is made with an oscillating saw.      The tibia is subluxed forward and the menisci are removed. The extramedullary alignment guide is placed referencing proximally at the medial aspect of the tibial tubercle and distally along the second metatarsal axis and tibial crest. The block is pinned to remove 76mm off the more deficient medial  side. Resection is made with an oscillating saw. Size 6is the most appropriate size for the tibia and the proximal tibia is prepared with the modular drill and keel punch for that size.      The femoral sizing guide is placed and size 6 is most appropriate. Rotation is marked off the epicondylar axis and confirmed by creating a rectangular flexion gap at 90 degrees. The size 6 cutting block is pinned in this rotation and the anterior, posterior and chamfer cuts are made with the oscillating saw. The intercondylar block is then placed and that cut is made.      Trial size 6 tibial component,  trial size 6 posterior stabilized femur and a 6  mm posterior stabilized rotating platform insert trial is placed. Full extension is achieved with excellent varus/valgus and anterior/posterior balance throughout full range of motion. The patella is everted and thickness measured to be 22  mm. Free hand resection is taken to 12 mm, a 38 template is placed, lug holes are drilled, trial patella is placed, and it tracks normally.  Osteophytes are removed off the posterior femur with the trial in place. All trials are removed and the cut bone surfaces prepared with pulsatile lavage. Cement is mixed and once ready for implantation, the size 6 tibial implant, size  6 posterior stabilized femoral component, and the size 38 patella are cemented in place and the patella is held with the clamp. The trial insert is placed and the knee held in full extension. The Exparel (20 ml mixed with 30 ml saline) and .25% Bupivicaine, are injected into the extensor mechanism, posterior capsule, medial and lateral gutters and subcutaneous tissues.  All extruded cement is removed and once the cement is hard the permanent 6 mm posterior stabilized rotating platform insert is placed into the tibial tray.      The wound is copiously irrigated with saline solution and the extensor mechanism closed over a hemovac drain with #1 V-loc suture. The tourniquet is released for a total tourniquet time of 33  minutes. Flexion against gravity is 140 degrees and the patella tracks normally. Subcutaneous tissue is closed with 2.0 vicryl and subcuticular with running 4.0 Monocryl. The incision is cleaned and dried and steri-strips and a bulky sterile dressing are applied. The limb is placed into a knee immobilizer and the patient is awakened and transported to recovery in stable condition.      Please note that a surgical assistant was a medical necessity for this procedure in order to perform it in a safe and expeditious manner. Surgical assistant was necessary to retract the ligaments and vital neurovascular structures to prevent injury to them and also necessary for proper positioning of the limb to allow for anatomic placement of the prosthesis.   Dione Plover Corran Lalone, MD    04/21/2015, 2:48 PM

## 2015-04-21 NOTE — Anesthesia Postprocedure Evaluation (Signed)
  Anesthesia Post-op Note  Patient: Brandi Mosley  Procedure(s) Performed: Procedure(s) (LRB): TOTAL KNEE ARTHROPLASTY (Right)  Patient Location: PACU  Anesthesia Type: Spinal  Level of Consciousness: awake and alert   Airway and Oxygen Therapy: Patient Spontanous Breathing  Post-op Pain: mild  Post-op Assessment: Post-op Vital signs reviewed, Patient's Cardiovascular Status Stable, Respiratory Function Stable, Patent Airway and No signs of Nausea or vomiting  Last Vitals:  Filed Vitals:   04/21/15 1735  BP: 123/70  Pulse: 78  Temp: 36.3 C  Resp: 16    Post-op Vital Signs: stable   Complications: No apparent anesthesia complications

## 2015-04-21 NOTE — Anesthesia Preprocedure Evaluation (Addendum)
Anesthesia Evaluation  Patient identified by MRN, date of birth, ID band Patient awake    Reviewed: Allergy & Precautions, NPO status , Patient's Chart, lab work & pertinent test results  Airway Mallampati: II  TM Distance: >3 FB Neck ROM: Full    Dental no notable dental hx.    Pulmonary neg pulmonary ROS, former smoker,    Pulmonary exam normal breath sounds clear to auscultation       Cardiovascular negative cardio ROS Normal cardiovascular exam Rhythm:Regular Rate:Normal     Neuro/Psych negative neurological ROS  negative psych ROS   GI/Hepatic negative GI ROS, Neg liver ROS,   Endo/Other  negative endocrine ROS  Renal/GU negative Renal ROS  negative genitourinary   Musculoskeletal negative musculoskeletal ROS (+)   Abdominal   Peds negative pediatric ROS (+)  Hematology negative hematology ROS (+)   Anesthesia Other Findings   Reproductive/Obstetrics negative OB ROS                          Anesthesia Physical Anesthesia Plan  ASA: II  Anesthesia Plan: Spinal   Post-op Pain Management:    Induction:   Airway Management Planned: Simple Face Mask  Additional Equipment:   Intra-op Plan:   Post-operative Plan:   Informed Consent: I have reviewed the patients History and Physical, chart, labs and discussed the procedure including the risks, benefits and alternatives for the proposed anesthesia with the patient or authorized representative who has indicated his/her understanding and acceptance.   Dental advisory given  Plan Discussed with: CRNA  Anesthesia Plan Comments:         Anesthesia Quick Evaluation

## 2015-04-21 NOTE — Anesthesia Procedure Notes (Addendum)
Procedure Name: MAC Date/Time: 04/21/2015 1:26 PM Performed by: Carleene Cooper A Pre-anesthesia Checklist: Patient identified, Timeout performed, Emergency Drugs available, Suction available and Patient being monitored Patient Re-evaluated:Patient Re-evaluated prior to inductionOxygen Delivery Method: Simple face mask Dental Injury: Teeth and Oropharynx as per pre-operative assessment    Spinal Patient location during procedure: OR Staffing Anesthesiologist: Montez Hageman Performed by: anesthesiologist  Preanesthetic Checklist Completed: patient identified, site marked, surgical consent, pre-op evaluation, timeout performed, IV checked, risks and benefits discussed and monitors and equipment checked Spinal Block Patient position: sitting Prep: Betadine Patient monitoring: heart rate, continuous pulse ox and blood pressure Approach: right paramedian Location: L4-5 Injection technique: single-shot Needle Needle type: Sprotte  Needle gauge: 24 G Needle length: 9 cm Additional Notes Expiration date of kit checked and confirmed. Patient tolerated procedure well, without complications.

## 2015-04-22 ENCOUNTER — Encounter (HOSPITAL_COMMUNITY): Payer: Self-pay | Admitting: Orthopedic Surgery

## 2015-04-22 LAB — BASIC METABOLIC PANEL
Anion gap: 4 — ABNORMAL LOW (ref 5–15)
BUN: 8 mg/dL (ref 6–20)
CO2: 27 mmol/L (ref 22–32)
CREATININE: 0.7 mg/dL (ref 0.44–1.00)
Calcium: 8.6 mg/dL — ABNORMAL LOW (ref 8.9–10.3)
Chloride: 106 mmol/L (ref 101–111)
GFR calc Af Amer: >60 mL/min (ref >60)
GLUCOSE: 147 mg/dL — AB (ref 65–99)
Potassium: 4.3 mmol/L (ref 3.5–5.1)
SODIUM: 137 mmol/L (ref 135–145)

## 2015-04-22 LAB — CBC
HCT: 35.3 % — ABNORMAL LOW (ref 36.0–46.0)
Hemoglobin: 12 g/dL (ref 12.0–15.0)
MCH: 32.5 pg (ref 26.0–34.0)
MCHC: 34 g/dL (ref 30.0–36.0)
MCV: 95.7 fL (ref 78.0–100.0)
PLATELETS: 239 10*3/uL (ref 150–400)
RBC: 3.69 MIL/uL — ABNORMAL LOW (ref 3.87–5.11)
RDW: 12.6 % (ref 11.5–15.5)
WBC: 14.8 10*3/uL — ABNORMAL HIGH (ref 4.0–10.5)

## 2015-04-22 MED ORDER — SODIUM CHLORIDE 0.9 % IV BOLUS (SEPSIS)
250.0000 mL | Freq: Once | INTRAVENOUS | Status: AC
  Administered 2015-04-22: 250 mL via INTRAVENOUS

## 2015-04-22 MED ORDER — SODIUM CHLORIDE 0.9 % IV BOLUS (SEPSIS)
500.0000 mL | Freq: Once | INTRAVENOUS | Status: AC
  Administered 2015-04-22: 500 mL via INTRAVENOUS

## 2015-04-22 NOTE — Discharge Instructions (Addendum)
° °Dr. Frank Aluisio °Total Joint Specialist °[facility] °[address] °[place], [place] [address] °[phone] ° °TOTAL KNEE REPLACEMENT POSTOPERATIVE DIRECTIONS ° °Knee Rehabilitation, Guidelines Following Surgery  °Results after knee surgery are often greatly improved when you follow the exercise, range of motion and muscle strengthening exercises prescribed by your doctor. Safety measures are also important to protect the knee from further injury. Any time any of these exercises cause you to have increased pain or swelling in your knee joint, decrease the amount until you are comfortable again and slowly increase them. If you have problems or questions, call your caregiver or physical therapist for advice.  ° °HOME CARE INSTRUCTIONS  °Remove items at home which could result in a fall. This includes throw rugs or furniture in walking pathways.  °· ICE to the affected knee every three hours for 30 minutes at a time and then as needed for pain and swelling.  Continue to use ice on the knee for pain and swelling from surgery. You may notice swelling that will progress down to the foot and ankle.  This is normal after surgery.  Elevate the leg when you are not up walking on it.   °· Continue to use the breathing machine which will help keep your temperature down.  It is common for your temperature to cycle up and down following surgery, especially at night when you are not up moving around and exerting yourself.  The breathing machine keeps your lungs expanded and your temperature down. °· Do not place pillow under knee, focus on keeping the knee straight while resting ° °DIET °You may resume your previous home diet once your are discharged from the hospital. ° °DRESSING / WOUND CARE / SHOWERING °You may shower 3 days after surgery, but keep the wounds dry during showering.  You may use an occlusive plastic wrap (Press'n Seal for example), NO SOAKING/SUBMERGING IN THE BATHTUB.  If the  bandage gets wet, change with a clean dry gauze.  If the incision gets wet, pat the wound dry with a clean towel. °You may start showering once you are discharged home but do not submerge the incision under water. Just pat the incision dry and apply a dry gauze dressing on daily. °Change the surgical dressing daily and reapply a dry dressing each time. ° °ACTIVITY °Walk with your walker as instructed. °Use walker as long as suggested by your caregivers. °Avoid periods of inactivity such as sitting longer than an hour when not asleep. This helps prevent blood clots.  °You may resume a sexual relationship in one month or when given the OK by your doctor.  °You may return to work once you are cleared by your doctor.  °Do not drive a car for 6 weeks or until released by you surgeon.  °Do not drive while taking narcotics. ° °WEIGHT BEARING °Weight bearing as tolerated with assist device (walker, cane, etc) as directed, use it as long as suggested by your surgeon or therapist, typically at least 4-6 weeks. ° °POSTOPERATIVE CONSTIPATION PROTOCOL °Constipation - defined medically as fewer than three stools per week and severe constipation as less than one stool per week. ° °One of the most common issues patients have following surgery is constipation.  Even if you have a regular bowel pattern at home, your normal regimen is likely to be disrupted due to multiple reasons following surgery.  Combination of anesthesia, postoperative narcotics, change in appetite and fluid intake all can affect your bowels.    In order to avoid complications following surgery, here are some recommendations in order to help you during your recovery period. ° °Colace (docusate) - Pick up an over-the-counter form of Colace or another stool softener and take twice a day as long as you are requiring postoperative pain medications.  Take with a full glass of water daily.  If you experience loose stools or diarrhea, hold the colace until you stool forms  back up.  If your symptoms do not get better within 1 week or if they get worse, check with your doctor. ° °Dulcolax (bisacodyl) - Pick up over-the-counter and take as directed by the product packaging as needed to assist with the movement of your bowels.  Take with a full glass of water.  Use this product as needed if not relieved by Colace only.  ° °MiraLax (polyethylene glycol) - Pick up over-the-counter to have on hand.  MiraLax is a solution that will increase the amount of water in your bowels to assist with bowel movements.  Take as directed and can mix with a glass of water, juice, soda, coffee, or tea.  Take if you go more than two days without a movement. °Do not use MiraLax more than once per day. Call your doctor if you are still constipated or irregular after using this medication for 7 days in a row. ° °If you continue to have problems with postoperative constipation, please contact the office for further assistance and recommendations.  If you experience "the worst abdominal pain ever" or develop nausea or vomiting, please contact the office immediatly for further recommendations for treatment. ° °ITCHING ° If you experience itching with your medications, try taking only a single pain pill, or even half a pain pill at a time.  You can also use Benadryl over the counter for itching or also to help with sleep.  ° °TED HOSE STOCKINGS °Wear the elastic stockings on both legs for three weeks following surgery during the day but you may remove then at night for sleeping. ° °MEDICATIONS °See your medication summary on the “After Visit Summary” that the nursing staff will review with you prior to discharge.  You may have some home medications which will be placed on hold until you complete the course of blood thinner medication.  It is important for you to complete the blood thinner medication as prescribed by your surgeon.  Continue your approved medications as instructed at time of  discharge. ° °PRECAUTIONS °If you experience chest pain or shortness of breath - call 911 immediately for transfer to the hospital emergency department.  °If you develop a fever greater that 101 F, purulent drainage from wound, increased redness or drainage from wound, foul odor from the wound/dressing, or calf pain - CONTACT YOUR SURGEON.   °                                                °FOLLOW-UP APPOINTMENTS °Make sure you keep all of your appointments after your operation with your surgeon and caregivers. You should call the office at the above phone number and make an appointment for approximately two weeks after the date of your surgery or on the date instructed by your surgeon outlined in the "After Visit Summary". ° ° °RANGE OF MOTION AND STRENGTHENING EXERCISES  °Rehabilitation of the knee is important following a knee injury or   an operation. After just a few days of immobilization, the muscles of the thigh which control the knee become weakened and shrink (atrophy). Knee exercises are designed to build up the tone and strength of the thigh muscles and to improve knee motion. Often times heat used for twenty to thirty minutes before working out will loosen up your tissues and help with improving the range of motion but do not use heat for the first two weeks following surgery. These exercises can be done on a training (exercise) mat, on the floor, on a table or on a bed. Use what ever works the best and is most comfortable for you Knee exercises include:  °Leg Lifts - While your knee is still immobilized in a splint or cast, you can do straight leg raises. Lift the leg to 60 degrees, hold for 3 sec, and slowly lower the leg. Repeat 10-20 times 2-3 times daily. Perform this exercise against resistance later as your knee gets better.  °Quad and Hamstring Sets - Tighten up the muscle on the front of the thigh (Quad) and hold for 5-10 sec. Repeat this 10-20 times hourly. Hamstring sets are done by pushing the  foot backward against an object and holding for 5-10 sec. Repeat as with quad sets.  °· Leg Slides: Lying on your back, slowly slide your foot toward your buttocks, bending your knee up off the floor (only go as far as is comfortable). Then slowly slide your foot back down until your leg is flat on the floor again. °· Angel Wings: Lying on your back spread your legs to the side as far apart as you can without causing discomfort.  °A rehabilitation program following serious knee injuries can speed recovery and prevent re-injury in the future due to weakened muscles. Contact your doctor or a physical therapist for more information on knee rehabilitation.  ° °IF YOU ARE TRANSFERRED TO A SKILLED REHAB FACILITY °If the patient is transferred to a skilled rehab facility following release from the hospital, a list of the current medications will be sent to the facility for the patient to continue.  When discharged from the skilled rehab facility, please have the facility set up the patient's Home Health Physical Therapy prior to being released. Also, the skilled facility will be responsible for providing the patient with their medications at time of release from the facility to include their pain medication, the muscle relaxants, and their blood thinner medication. If the patient is still at the rehab facility at time of the two week follow up appointment, the skilled rehab facility will also need to assist the patient in arranging follow up appointment in our office and any transportation needs. ° °MAKE SURE YOU:  °Understand these instructions.  °Get help right away if you are not doing well or get worse.  ° ° °Pick up stool softner and laxative for home use following surgery while on pain medications. °Do not submerge incision under water. °Please use good hand washing techniques while changing dressing each day. °May shower starting three days after surgery. °Please use a clean towel to pat the incision dry following  showers. °Continue to use ice for pain and swelling after surgery. °Do not use any lotions or creams on the incision until instructed by your surgeon. ° °Take Xarelto for two and a half more weeks, then discontinue Xarelto. °Once the patient has completed the blood thinner regimen, then take a Baby 81 mg Aspirin daily for three more weeks. ° ° °Information   on my medicine - XARELTO® (Rivaroxaban) ° °This medication education was reviewed with me or my healthcare representative as part of my discharge preparation.  The pharmacist that spoke with me during my hospital stay was:  Gadhia, Jigna M, RPH ° °Why was Xarelto® prescribed for you? °Xarelto® was prescribed for you to reduce the risk of blood clots forming after orthopedic surgery. The medical term for these abnormal blood clots is venous thromboembolism (VTE). ° °What do you need to know about xarelto® ? °Take your Xarelto® ONCE DAILY at the same time every day. °You may take it either with or without food. ° °If you have difficulty swallowing the tablet whole, you may crush it and mix in applesauce just prior to taking your dose. ° °Take Xarelto® exactly as prescribed by your doctor and DO NOT stop taking Xarelto® without talking to the doctor who prescribed the medication.  Stopping without other VTE prevention medication to take the place of Xarelto® may increase your risk of developing a clot. ° °After discharge, you should have regular check-up appointments with your healthcare provider that is prescribing your Xarelto®.   ° °What do you do if you miss a dose? °If you miss a dose, take it as soon as you remember on the same day then continue your regularly scheduled once daily regimen the next day. Do not take two doses of Xarelto® on the same day.  ° °Important Safety Information °A possible side effect of Xarelto® is bleeding. You should call your healthcare provider right away if you experience any of the following: °? Bleeding from an injury or your  nose that does not stop. °? Unusual colored urine (red or dark brown) or unusual colored stools (red or black). °? Unusual bruising for unknown reasons. °? A serious fall or if you hit your head (even if there is no bleeding). ° °Some medicines may interact with Xarelto® and might increase your risk of bleeding while on Xarelto®. To help avoid this, consult your healthcare provider or pharmacist prior to using any new prescription or non-prescription medications, including herbals, vitamins, non-steroidal anti-inflammatory drugs (NSAIDs) and supplements. ° °This website has more information on Xarelto®: www.xarelto.com. ° ° °

## 2015-04-22 NOTE — Progress Notes (Signed)
Physical Therapy Treatment Note    04/22/15 1400  PT Visit Information  Last PT Received On 04/22/15  Assistance Needed +1  History of Present Illness Pt is a 59 year old female s/p R TKA with hx of breast cancer  PT Time Calculation  PT Start Time (ACUTE ONLY) 1357  PT Stop Time (ACUTE ONLY) 1408  PT Time Calculation (min) (ACUTE ONLY) 11 min  Subjective Data  Subjective Pt ambulated again in hallway.  Progressing well with mobility.  Precautions  Precautions Fall;Knee  Required Braces or Orthoses Knee Immobilizer - Right  Knee Immobilizer - Right Discontinue once straight leg raise with < 10 degree lag  Restrictions  Other Position/Activity Restrictions WBAT  Pain Assessment  Pain Assessment 0-10  Pain Score 4  Pain Location R knee  Pain Descriptors / Indicators Aching;Sore  Pain Intervention(s) Limited activity within patient's tolerance;Monitored during session;Repositioned;Ice applied  Cognition  Arousal/Alertness Awake/alert  Behavior During Therapy WFL for tasks assessed/performed  Overall Cognitive Status Within Functional Limits for tasks assessed  Bed Mobility  General bed mobility comments pt up in recliner  Transfers  Overall transfer level Needs assistance  Equipment used Rolling walker (2 wheeled)  Transfers Sit to/from Stand  Sit to Stand Min guard  General transfer comment verbal cues for safe technique  Ambulation/Gait  Ambulation/Gait assistance Min guard  Ambulation Distance (Feet) 160 Feet  Assistive device Rolling walker (2 wheeled)  Gait Pattern/deviations Step-to pattern;Step-through pattern;Antalgic;Decreased stance time - right  General Gait Details verbal cues for sequence, RW positioning, posture, step length, encouraged step to pattern due to pain however pt preferred step through  PT - End of Session  Equipment Utilized During Treatment Right knee immobilizer  Activity Tolerance Patient tolerated treatment well  Patient left in chair;with  call bell/phone within reach  PT - Assessment/Plan  PT Plan Current plan remains appropriate  PT Frequency (ACUTE ONLY) 7X/week  Follow Up Recommendations SNF  PT equipment Rolling walker with 5" wheels  PT Goal Progression  Progress towards PT goals Progressing toward goals  PT General Charges  $$ ACUTE PT VISIT 1 Procedure  PT Treatments  $Gait Training 8-22 mins   Carmelia Bake, PT, DPT 04/22/2015 Pager: [phone]

## 2015-04-22 NOTE — NC FL2 (Signed)
[place] LEVEL OF CARE SCREENING TOOL     IDENTIFICATION  Patient Name: Brandi Mosley Birthdate: 03/30/1956 Sex: female Admission Date (Current Location): 04/21/2015  [facility] and [place] Number:     Facility and Address:  [facility],  [place] [address], [place]      Provider Number: [id number]  Attending Physician Name and Address:  Gaynelle Arabian, MD  Relative Name and Phone Number:       Current Level of Care: Hospital Recommended Level of Care: [place] Prior Approval Number:    Date Approved/Denied:   PASRR Number: [id number]  Discharge Plan: SNF    Current Diagnoses: Patient Active Problem List   Diagnosis Date Noted  . OA (osteoarthritis) of knee 04/21/2015  . Hypothyroidism 11/29/2014    Orientation ACTIVITIES/SOCIAL BLADDER RESPIRATION    Self, Time, Situation, Place  Passive Continent Normal  BEHAVIORAL SYMPTOMS/MOOD NEUROLOGICAL BOWEL NUTRITION STATUS   (no behaviors)   Continent Diet  PHYSICIAN VISITS COMMUNICATION OF NEEDS Height & Weight Skin    Verbally 5\' 10"  (177.8 cm) 159 lbs. Normal          AMBULATORY STATUS RESPIRATION     (min assist) Normal      Personal Care Assistance Level of Assistance  Bathing, Dressing Bathing Assistance: Limited assistance   Dressing Assistance: Limited assistance      Functional Limitations Info  Sight, Hearing, Speech Sight Info: Adequate Hearing Info: Adequate Speech Info: Adequate       SPECIAL CARE FACTORS FREQUENCY  PT (By licensed PT)     PT Frequency: 6-7 x daily             Additional Factors Info                  Current Medications (04/22/2015): Current Facility-Administered Medications  Medication Dose Route Frequency Provider Last Rate Last Dose  . 0.9 %  sodium chloride infusion   Intravenous Continuous Arlee Muslim, PA-C   Stopped at 04/22/15 1100  . acetaminophen (TYLENOL) tablet 650 mg  650 mg Oral Q6H PRN  Gaynelle Arabian, MD       Or  . acetaminophen (TYLENOL) suppository 650 mg  650 mg Rectal Q6H PRN Gaynelle Arabian, MD      . acetaminophen (TYLENOL) tablet 1,000 mg  1,000 mg Oral Q6H Gaynelle Arabian, MD   1,000 mg at 04/22/15 0909  . bisacodyl (DULCOLAX) suppository 10 mg  10 mg Rectal Daily PRN Gaynelle Arabian, MD      . diphenhydrAMINE (BENADRYL) 12.5 MG/5ML elixir 12.5-25 mg  12.5-25 mg Oral Q4H PRN Gaynelle Arabian, MD      . docusate sodium (COLACE) capsule 100 mg  100 mg Oral BID Gaynelle Arabian, MD   100 mg at 04/22/15 0909  . ketorolac (TORADOL) 15 MG/ML injection 7.5 mg  7.5 mg Intravenous Q6H PRN Gaynelle Arabian, MD   7.5 mg at 04/21/15 2338  . levothyroxine (SYNTHROID, LEVOTHROID) tablet 175 mcg  175 mcg Oral QAC breakfast Gaynelle Arabian, MD   175 mcg at 04/22/15 0909  . menthol-cetylpyridinium (CEPACOL) lozenge 3 mg  1 lozenge Oral PRN Gaynelle Arabian, MD       Or  . phenol (CHLORASEPTIC) mouth spray 1 spray  1 spray Mouth/Throat PRN Gaynelle Arabian, MD      . methocarbamol (ROBAXIN) tablet 500 mg  500 mg Oral Q6H PRN Gaynelle Arabian, MD   500 mg at 04/21/15 2337   Or  . methocarbamol (ROBAXIN) 500 mg  in dextrose 5 % 50 mL IVPB  500 mg Intravenous Q6H PRN Gaynelle Arabian, MD   500 mg at 04/21/15 1635  . metoCLOPramide (REGLAN) tablet 5-10 mg  5-10 mg Oral Q8H PRN Gaynelle Arabian, MD       Or  . metoCLOPramide (REGLAN) injection 5-10 mg  5-10 mg Intravenous Q8H PRN Gaynelle Arabian, MD      . morphine 2 MG/ML injection 1 mg  1 mg Intravenous Q2H PRN Gaynelle Arabian, MD      . ondansetron [facility]) tablet 4 mg  4 mg Oral Q6H PRN Gaynelle Arabian, MD       Or  . ondansetron [facility]) injection 4 mg  4 mg Intravenous Q6H PRN Gaynelle Arabian, MD      . oxyCODONE (Oxy IR/ROXICODONE) immediate release tablet 5-10 mg  5-10 mg Oral Q3H PRN Gaynelle Arabian, MD      . polyethylene glycol (MIRALAX / GLYCOLAX) packet 17 g  17 g Oral Daily PRN Gaynelle Arabian, MD      . rivaroxaban [facility]) tablet 10 mg  10 mg Oral Q breakfast Gaynelle Arabian, MD   10 mg at 04/22/15 0909  . sodium phosphate (FLEET) 7-19 GM/118ML enema 1 enema  1 enema Rectal Once PRN Gaynelle Arabian, MD      . traMADol [facility]at[facility]bon) tablet 50-100 mg  50-100 mg Oral Q6H PRN Gaynelle Arabian, MD   50 mg at 04/22/15 1129   Do not use this list as official medication orders. Please verify with discharge summary.  Discharge Medications:   Medication List    ASK your doctor about these medications        levothyroxine 175 MCG tablet  Commonly known as:  SYNTHROID, LEVOTHROID  Take 175 mcg by mouth daily before breakfast. Uses brand name only        Relevant Imaging Results:  Relevant Lab Results:  Recent Labs    Additional Information ss # [id number]  Roselie Cirigliano, Randall An, LCSW

## 2015-04-22 NOTE — Evaluation (Signed)
Physical Therapy Evaluation Patient Details Name: Brandi Mosley MRN: [id number] DOB: 08-14-1955 Today's Date: 04/22/2015   History of Present Illness  Pt is a 59 year old female s/p R TKA with hx of breast cancer  Clinical Impression  Pt is s/p R TKA resulting in the deficits listed below (see PT Problem List).  Pt will benefit from skilled PT to increase their independence and safety with mobility to allow discharge to the venue listed below.  Pt plans to d/c to SNF.     Follow Up Recommendations SNF    Equipment Recommendations  Rolling walker with 5" wheels    Recommendations for Other Services       Precautions / Restrictions Precautions Precautions: Fall;Knee Required Braces or Orthoses: Knee Immobilizer - Right Knee Immobilizer - Right: Discontinue once straight leg raise with < 10 degree lag Restrictions Other Position/Activity Restrictions: WBAT      Mobility  Bed Mobility Overal bed mobility: Needs Assistance Bed Mobility: Supine to Sit     Supine to sit: Supervision     General bed mobility comments: verbal cues for self assist  Transfers Overall transfer level: Needs assistance Equipment used: Rolling walker (2 wheeled) Transfers: Sit to/from Stand Sit to Stand: Min assist         General transfer comment: verbal cues for UE and LE positioning  Ambulation/Gait Ambulation/Gait assistance: Min assist Ambulation Distance (Feet): 80 Feet Assistive device: Rolling walker (2 wheeled) Gait Pattern/deviations: Step-to pattern;Antalgic     General Gait Details: verbal cues for sequence, RW positioning, posture, step length, occasional steadying assist, multiple short standing rest breaks due to "wooziness" however pt declined sitting  Stairs            Wheelchair Mobility    Modified Rankin (Stroke Patients Only)       Balance                                             Pertinent Vitals/Pain Pain Assessment:  0-10 Pain Score: 5  Pain Location: R knee Pain Descriptors / Indicators: Sore;Aching Pain Intervention(s): Limited activity within patient's tolerance;Monitored during session;Repositioned;Ice applied    Home Living Family/patient expects to be discharged to:: Skilled nursing facility Living Arrangements: Alone               Additional Comments: has 3 steps at home    Prior Function Level of Independence: Independent               Hand Dominance        Extremity/Trunk Assessment               Lower Extremity Assessment: RLE deficits/detail RLE Deficits / Details: able to perform SLR however painful, -10-60* AAROM R knee flexion       Communication   Communication: No difficulties  Cognition Arousal/Alertness: Awake/alert Behavior During Therapy: WFL for tasks assessed/performed Overall Cognitive Status: Within Functional Limits for tasks assessed                      General Comments      Exercises Total Joint Exercises Ankle Circles/Pumps: AROM;Both;10 reps Quad Sets: AROM;Both;10 reps Short Arc QuadSinclair Mosley;Right;10 reps Heel Slides: AAROM;Right;10 reps Hip ABduction/ADduction: 10 reps;AROM;Right Straight Leg Raises: AROM;Right;10 reps      Assessment/Plan    PT Assessment Patient needs continued PT services  PT  Diagnosis Difficulty walking;Acute pain   PT Problem List Decreased strength;Decreased range of motion;Decreased balance;Decreased mobility;Decreased knowledge of use of DME;Pain  PT Treatment Interventions Functional mobility training;Stair training;Gait training;DME instruction;Patient/family education;Therapeutic activities;Therapeutic exercise   PT Goals (Current goals can be found in the Care Plan section) Acute Rehab PT Goals PT Goal Formulation: With patient Time For Goal Achievement: 04/26/15 Potential to Achieve Goals: Good    Frequency 7X/week   Barriers to discharge        Co-evaluation                End of Session Equipment Utilized During Treatment: Gait belt;Right knee immobilizer Activity Tolerance: Patient tolerated treatment well Patient left: in chair;with call bell/phone within reach           Time: 0920-0943 PT Time Calculation (min) (ACUTE ONLY): 23 min   Charges:   PT Evaluation $Initial PT Evaluation Tier I: 1 Procedure PT Treatments $Therapeutic Exercise: 8-22 mins   PT G Codes:        Brandi Mosley,Brandi Mosley 04/22/2015, 10:22 AM Brandi Mosley, PT, DPT 04/22/2015 Pager: [phone]

## 2015-04-22 NOTE — Progress Notes (Signed)
Notified Merla Riches PA about pt's blood pressure of 86/54 manual. One time dose of 250cc NS bolus ordered. Will continue to monitor.

## 2015-04-22 NOTE — Clinical Social Work Note (Signed)
Clinical Social Work Assessment  Patient Details  Name: Brandi Mosley MRN: [id number] Date of Birth: 01-06-1956  Date of referral:  04/22/15               Reason for consult:  Facility Placement, Discharge Planning                Permission sought to share information with:  Facility Art therapist granted to share information::  Yes, Verbal Permission Granted  Name::        Agency::     Relationship::     Contact Information:     Housing/Transportation Living arrangements for the past 2 months:  Apartment Source of Information:  Patient Patient Interpreter Needed:  None Criminal Activity/Legal Involvement Pertinent to Current Situation/Hospitalization:  No - Comment as needed Significant Relationships:  Friend Lives with:  Self Do you feel safe going back to the place where you live?   (ST Rehab recommended.) Need for family participation in patient care:  No (Coment)  Care giving concerns: Pt's care cannot be managed at home following hospital d/c.   Social Worker assessment / plan: Pt hospitalized on 04/22/15 for pre planned right total knee arthroplasty. CSW has met with pt to assist with d/c planning. PT has recommended ST rehab at d/c. Pt is in agreement with this plan and has requested [facility] for rehab. SNF has been contacted and is able to offer placement at d/c. CSW has contacted [facility] and authorization has been requested. A decision is pending. CSW will continue to follow to assist with d/c planning to SNF.  Employment status:  Therapist, music:  Managed Care PT Recommendations:  [place] / Referral to community resources:  [place]  Patient/Family's Response to care:  Pt's care cannot be managed at home at d/c.  Patient/Family's Understanding of and Emotional Response to Diagnosis, Current Treatment, and Prognosis: Pt is aware of her medical status. She is motivated to  work with therapy.  Emotional Assessment Appearance:  Appears stated age Attitude/Demeanor/Rapport:  Other (cooperative) Affect (typically observed):  Calm, Pleasant Orientation:  Oriented to Self, Oriented to Place, Oriented to  Time, Oriented to Situation Alcohol / Substance use:  Alcohol Use Psych involvement (Current and /or in the community):  No (Comment)  Discharge Needs  Concerns to be addressed:  Discharge Planning Concerns Readmission within the last 30 days:  No Current discharge risk:  None Barriers to Discharge:  No Barriers Identified   Luretha Rued, [place] 04/22/2015, 3:00 PM

## 2015-04-22 NOTE — Progress Notes (Signed)
   Subjective: 1 Day Post-Op Procedure(s) (LRB): TOTAL KNEE ARTHROPLASTY (Right) Patient reports pain as mild.   Patient seen in rounds with Dr. Wynelle Link.  Sitting up in bed. Feels good this morning. Patient is well, but has had some minor complaints of pain in the knee, requiring pain medications We will start therapy today.  Plan is to go Skilled nursing facility after hospital stay.  Looking into [facility].  Objective: Vital signs in last 24 hours: Temp:  [97.4 F (36.3 C)-98.6 F (37 C)] 98.4 F (36.9 C) (11/15 0556) Pulse Rate:  [57-101] 60 (11/15 0556) Resp:  [11-18] 16 (11/15 0556) BP: (72-135)/(39-81) 97/59 mmHg (11/15 0909) SpO2:  [97 %-100 %] 97 % (11/15 0556) Weight:  [72.122 kg (159 lb)] 72.122 kg (159 lb) (11/14 1735)  Intake/Output from previous day:  Intake/Output Summary (Last 24 hours) at 04/22/15 0916 Last data filed at 04/22/15 0900  Gross per 24 hour  Intake 5601.25 ml  Output   6775 ml  Net -1173.75 ml    Intake/Output this shift: Total I/O In: 240 [P.O.:240] Out: 300 [Urine:300]  Labs:  Recent Labs  04/22/15 0532  HGB 12.0    Recent Labs  04/22/15 0532  WBC 14.8*  RBC 3.69*  HCT 35.3*  PLT 239    Recent Labs  04/22/15 0532  NA 137  K 4.3  CL 106  CO2 27  BUN 8  CREATININE 0.70  GLUCOSE 147*  CALCIUM 8.6*   No results for input(s): LABPT, INR in the last 72 hours.  EXAM General - Patient is Alert, Appropriate and Oriented Extremity - Neurovascular intact Sensation intact distally Dorsiflexion/Plantar flexion intact Dressing - dressing C/D/I Motor Function - intact, moving foot and toes well on exam.  Hemovac pulled without difficulty.  Past Medical History  Diagnosis Date  . Osteoarthritis     right knee  . Hypothyroidism   . Cancer ([place])     breast cancer on right     Assessment/Plan: 1 Day Post-Op Procedure(s) (LRB): TOTAL KNEE ARTHROPLASTY (Right) Principal Problem:   OA (osteoarthritis) of  knee  Estimated body mass index is 22.81 kg/(m^2) as calculated from the following:   Height as of this encounter: 5\' 10"  (1.778 m).   Weight as of this encounter: 72.122 kg (159 lb). Advance diet Up with therapy Plan for discharge tomorrow Discharge to SNF  DVT Prophylaxis - Xarelto Weight-Bearing as tolerated to right leg D/C O2 and Pulse OX and try on Room Air  Arlee Muslim, PA-C Orthopaedic Surgery 04/22/2015, 9:16 AM

## 2015-04-22 NOTE — Care Management Note (Signed)
Case Management Note  Patient Details  Name: Panagiota Faul MRN: [id number] Date of Birth: 03-Oct-1955  Subjective/Objective:                  TOTAL KNEE ARTHROPLASTY (Right) Action/Plan: Discharge planning Expected Discharge Date:                  Expected Discharge Plan:     In-House Referral:     Discharge planning Services     Post Acute Care Choice:    Choice offered to:     DME Arranged:    DME Agency:     HH Arranged:    [place] Agency:     Status of Service:     Medicare Important Message Given:    Date Medicare IM Given:    Medicare IM give by:    Date Additional Medicare IM Given:    Additional Medicare Important Message give by:     If discussed at [place] of Stay Meetings, dates discussed:    Additional Comments: CM notes plan is for SNF; CSW aware and arranging.  No other CM needs were communicated. Dellie Catholic, RN 04/22/2015, 1:23 PM

## 2015-04-22 NOTE — Clinical Social Work Placement (Signed)
   CLINICAL SOCIAL WORK PLACEMENT  NOTE  Date:  04/22/2015  Patient Details  Name: Vallery Kosanke MRN: [id number] Date of Birth: 1956-01-14  Clinical Social Work is seeking post-discharge placement for this patient at the [place] level of care (*CSW will initial, date and re-position this form in  chart as items are completed):  No   Patient/family provided with [place] Work Department's list of facilities offering this level of care within the geographic area requested by the patient (or if unable, by the patient's family).  Yes   Patient/family informed of their freedom to choose among providers that offer the needed level of care, that participate in Medicare, Medicaid or managed care program needed by the patient, have an available bed and are willing to accept the patient.  Yes   Patient/family informed of [facility]'s ownership interest in [facility] and [facility], as well as of the fact that they are under no obligation to receive care at these facilities.  PASRR submitted to EDS on 04/22/15     PASRR number received on 04/22/15     Existing PASRR number confirmed on       FL2 transmitted to all facilities in geographic area requested by pt/family on 04/22/15     FL2 transmitted to all facilities within larger geographic area on       Patient informed that his/her managed care company has contracts with or will negotiate with certain facilities, including the following:        Yes   Patient/family informed of bed offers received.  Patient chooses bed at [facility]     Physician recommends and patient chooses bed at [facility]    Patient to be transferred to [facility] on  .  Patient to be transferred to facility by       Patient family notified on   of transfer.  Name of family member notified:        PHYSICIAN       Additional Comment:    _______________________________________________ Luretha Rued, [place] 04/22/2015, 3:05 PM

## 2015-04-22 NOTE — Progress Notes (Signed)
Notified [employer] PA about pt's low blood pressure 87/52. Pt is asymptomatic. Pending CBC. Will continue to monitor.

## 2015-04-23 LAB — BASIC METABOLIC PANEL
ANION GAP: 4 — AB (ref 5–15)
BUN: 7 mg/dL (ref 6–20)
CALCIUM: 8.2 mg/dL — AB (ref 8.9–10.3)
CO2: 28 mmol/L (ref 22–32)
Chloride: 105 mmol/L (ref 101–111)
Creatinine, Ser: 0.71 mg/dL (ref 0.44–1.00)
GFR calc Af Amer: >60 mL/min (ref >60)
GFR calc non Af Amer: >60 mL/min (ref >60)
GLUCOSE: 109 mg/dL — AB (ref 65–99)
Potassium: 3.7 mmol/L (ref 3.5–5.1)
Sodium: 137 mmol/L (ref 135–145)

## 2015-04-23 LAB — CBC
HEMATOCRIT: 30.3 % — AB (ref 36.0–46.0)
Hemoglobin: 10.1 g/dL — ABNORMAL LOW (ref 12.0–15.0)
MCH: 32 pg (ref 26.0–34.0)
MCHC: 33.3 g/dL (ref 30.0–36.0)
MCV: 95.9 fL (ref 78.0–100.0)
Platelets: 206 10*3/uL (ref 150–400)
RBC: 3.16 MIL/uL — ABNORMAL LOW (ref 3.87–5.11)
RDW: 13.1 % (ref 11.5–15.5)
WBC: 12.5 10*3/uL — AB (ref 4.0–10.5)

## 2015-04-23 MED ORDER — TRAMADOL HCL 50 MG PO TABS: 50.0000 mg | ORAL_TABLET | Freq: Four times a day (QID) | ORAL | Status: AC | PRN

## 2015-04-23 MED ORDER — OXYCODONE HCL 5 MG PO TABS: 5.0000 mg | ORAL_TABLET | ORAL | Status: AC | PRN

## 2015-04-23 MED ORDER — DOCUSATE SODIUM 100 MG PO CAPS: 100.0000 mg | ORAL_CAPSULE | Freq: Two times a day (BID) | ORAL | Status: DC

## 2015-04-23 MED ORDER — FLEET ENEMA 7-19 GM/118ML RE ENEM: 1.0000 | ENEMA | Freq: Once | RECTAL | Status: AC | PRN

## 2015-04-23 MED ORDER — DIPHENHYDRAMINE HCL 12.5 MG/5ML PO ELIX: 12.5000 mg | ORAL_SOLUTION | ORAL | Status: DC | PRN

## 2015-04-23 MED ORDER — METOCLOPRAMIDE HCL 5 MG PO TABS
5.0000 mg | ORAL_TABLET | Freq: Three times a day (TID) | ORAL | Status: AC | PRN
Start: 2015-04-23 — End: ?

## 2015-04-23 MED ORDER — RIVAROXABAN 10 MG PO TABS: 10.0000 mg | ORAL_TABLET | Freq: Every day | ORAL | Status: AC

## 2015-04-23 MED ORDER — ACETAMINOPHEN 325 MG PO TABS: 650.0000 mg | ORAL_TABLET | Freq: Four times a day (QID) | ORAL | Status: AC | PRN

## 2015-04-23 MED ORDER — POLYETHYLENE GLYCOL 3350 17 G PO PACK: 17.0000 g | PACK | Freq: Every day | ORAL | Status: AC | PRN

## 2015-04-23 MED ORDER — METHOCARBAMOL 500 MG PO TABS: 500.0000 mg | ORAL_TABLET | Freq: Four times a day (QID) | ORAL | Status: AC | PRN

## 2015-04-23 MED ORDER — ONDANSETRON HCL 4 MG PO TABS: 4.0000 mg | ORAL_TABLET | Freq: Four times a day (QID) | ORAL | Status: AC | PRN

## 2015-04-23 MED ORDER — BISACODYL 10 MG RE SUPP: 10.0000 mg | Freq: Every day | RECTAL | Status: AC | PRN

## 2015-04-23 NOTE — Discharge Summary (Signed)
Physician Discharge Summary   Patient ID: Brandi Mosley MRN: [id number] DOB/AGE: 02-04-1956 59 y.o.  Admit date: 04/21/2015 Discharge date: 04-23-2015  Primary Diagnosis:  Osteoarthritis Right knee(s)  Admission Diagnoses:  Past Medical History  Diagnosis Date  . Osteoarthritis     right knee  . Hypothyroidism   . Cancer [facility])     breast cancer on right    Discharge Diagnoses:   Principal Problem:   OA (osteoarthritis) of knee  Estimated body mass index is 22.81 kg/(m^2) as calculated from the following:   Height as of this encounter: $RemoveBeforeD'5\' 10"'[id number]$  (1.778 m).   Weight as of this encounter: 72.122 kg (159 lb).  Procedure:  Procedure(s) (LRB): TOTAL KNEE ARTHROPLASTY (Right)   Consults: None  HPI: Brandi Mosley is a 59 y.o. year old female with end stage OA of her right knee with progressively worsening pain and dysfunction. She has constant pain, with activity and at rest and significant functional deficits with difficulties even with ADLs. She has had extensive non-op management including analgesics, injections of cortisone, and home exercise program, but remains in significant pain with significant dysfunction.Radiographs show bone on bone arthritis patellofemoral with patellar erosion. She presents now for right Total Knee Arthroplasty.   Laboratory Data: Admission on 04/21/2015  Component Date Value Ref Range Status  . WBC 04/22/2015 14.8* 4.0 - 10.5 K/uL Final  . RBC 04/22/2015 3.69* 3.87 - 5.11 MIL/uL Final  . Hemoglobin 04/22/2015 12.0  12.0 - 15.0 g/dL Final  . HCT 04/22/2015 35.3* 36.0 - 46.0 % Final  . MCV 04/22/2015 95.7  78.0 - 100.0 fL Final  . MCH 04/22/2015 32.5  26.0 - 34.0 pg Final  . MCHC 04/22/2015 34.0  30.0 - 36.0 g/dL Final  . RDW 04/22/2015 12.6  11.5 - 15.5 % Final  . Platelets 04/22/2015 239  150 - 400 K/uL Final  . Sodium 04/22/2015 137  135 - 145 mmol/L Final  . Potassium 04/22/2015 4.3  3.5 - 5.1 mmol/L Final  . Chloride 04/22/2015 106   101 - 111 mmol/L Final  . CO2 04/22/2015 27  22 - 32 mmol/L Final  . Glucose, Bld 04/22/2015 147* 65 - 99 mg/dL Final  . BUN 04/22/2015 8  6 - 20 mg/dL Final  . Creatinine, Ser 04/22/2015 0.70  0.44 - 1.00 mg/dL Final  . Calcium 04/22/2015 8.6* 8.9 - 10.3 mg/dL Final  . GFR calc non Af Amer 04/22/2015 >60  >60 mL/min Final  . GFR calc Af Amer 04/22/2015 >60  >60 mL/min Final   Comment: (NOTE) The eGFR has been calculated using the CKD EPI equation. This calculation has not been validated in all clinical situations. eGFR's persistently <60 mL/min signify possible Chronic Kidney Disease.   . Anion gap 04/22/2015 4* 5 - 15 Final  . WBC 04/23/2015 12.5* 4.0 - 10.5 K/uL Final  . RBC 04/23/2015 3.16* 3.87 - 5.11 MIL/uL Final  . Hemoglobin 04/23/2015 10.1* 12.0 - 15.0 g/dL Final  . HCT 04/23/2015 30.3* 36.0 - 46.0 % Final  . MCV 04/23/2015 95.9  78.0 - 100.0 fL Final  . MCH 04/23/2015 32.0  26.0 - 34.0 pg Final  . MCHC 04/23/2015 33.3  30.0 - 36.0 g/dL Final  . RDW 04/23/2015 13.1  11.5 - 15.5 % Final  . Platelets 04/23/2015 206  150 - 400 K/uL Final  . Sodium 04/23/2015 137  135 - 145 mmol/L Final  . Potassium 04/23/2015 3.7  3.5 - 5.1 mmol/L Final  . Chloride 04/23/2015 105  101 - 111 mmol/L Final  . CO2 04/23/2015 28  22 - 32 mmol/L Final  . Glucose, Bld 04/23/2015 109* 65 - 99 mg/dL Final  . BUN 04/23/2015 7  6 - 20 mg/dL Final  . Creatinine, Ser 04/23/2015 0.71  0.44 - 1.00 mg/dL Final  . Calcium 04/23/2015 8.2* 8.9 - 10.3 mg/dL Final  . GFR calc non Af Amer 04/23/2015 >60  >60 mL/min Final  . GFR calc Af Amer 04/23/2015 >60  >60 mL/min Final   Comment: (NOTE) The eGFR has been calculated using the CKD EPI equation. This calculation has not been validated in all clinical situations. eGFR's persistently <60 mL/min signify possible Chronic Kidney Disease.   Georgiann Hahn gap 04/23/2015 4* 5 - 15 Final  Hospital Outpatient Visit on 04/15/2015  Component Date Value Ref Range Status  .  aPTT 04/15/2015 27  24 - 37 seconds Final  . WBC 04/15/2015 6.3  4.0 - 10.5 K/uL Final  . RBC 04/15/2015 4.49  3.87 - 5.11 MIL/uL Final  . Hemoglobin 04/15/2015 14.3  12.0 - 15.0 g/dL Final  . HCT 04/15/2015 43.7  36.0 - 46.0 % Final  . MCV 04/15/2015 97.3  78.0 - 100.0 fL Final  . MCH 04/15/2015 31.8  26.0 - 34.0 pg Final  . MCHC 04/15/2015 32.7  30.0 - 36.0 g/dL Final  . RDW 04/15/2015 12.9  11.5 - 15.5 % Final  . Platelets 04/15/2015 299  150 - 400 K/uL Final  . Sodium 04/15/2015 139  135 - 145 mmol/L Final  . Potassium 04/15/2015 4.1  3.5 - 5.1 mmol/L Final  . Chloride 04/15/2015 101  101 - 111 mmol/L Final  . CO2 04/15/2015 30  22 - 32 mmol/L Final  . Glucose, Bld 04/15/2015 117* 65 - 99 mg/dL Final  . BUN 04/15/2015 16  6 - 20 mg/dL Final  . Creatinine, Ser 04/15/2015 0.78  0.44 - 1.00 mg/dL Final  . Calcium 04/15/2015 9.7  8.9 - 10.3 mg/dL Final  . Total Protein 04/15/2015 7.2  6.5 - 8.1 g/dL Final  . Albumin 04/15/2015 4.6  3.5 - 5.0 g/dL Final  . AST 04/15/2015 30  15 - 41 U/L Final  . ALT 04/15/2015 24  14 - 54 U/L Final  . Alkaline Phosphatase 04/15/2015 52  38 - 126 U/L Final  . Total Bilirubin 04/15/2015 0.7  0.3 - 1.2 mg/dL Final  . GFR calc non Af Amer 04/15/2015 >60  >60 mL/min Final  . GFR calc Af Amer 04/15/2015 >60  >60 mL/min Final   Comment: (NOTE) The eGFR has been calculated using the CKD EPI equation. This calculation has not been validated in all clinical situations. eGFR's persistently <60 mL/min signify possible Chronic Kidney Disease.   . Anion gap 04/15/2015 8  5 - 15 Final  . Prothrombin Time 04/15/2015 13.4  11.6 - 15.2 seconds Final  . INR 04/15/2015 1.00  0.00 - 1.49 Final  . ABO/RH(D) 04/15/2015 A NEG   Final  . Antibody Screen 04/15/2015 NEG   Final  . Sample Expiration 04/15/2015 04/24/2015   Final  . Extend sample reason 04/15/2015 NO TRANSFUSIONS OR PREGNANCY IN THE PAST 3 MONTHS   Final  . Color, Urine 04/15/2015 YELLOW  YELLOW Final  .  APPearance 04/15/2015 CLEAR  CLEAR Final  . Specific Gravity, Urine 04/15/2015 1.008  1.005 - 1.030 Final  . pH 04/15/2015 7.0  5.0 - 8.0 Final  . Glucose, UA 04/15/2015 NEGATIVE  NEGATIVE mg/dL Final  .  Hgb urine dipstick 04/15/2015 NEGATIVE  NEGATIVE Final  . Bilirubin Urine 04/15/2015 NEGATIVE  NEGATIVE Final  . Ketones, ur 04/15/2015 NEGATIVE  NEGATIVE mg/dL Final  . Protein, ur 04/15/2015 NEGATIVE  NEGATIVE mg/dL Final  . Urobilinogen, UA 04/15/2015 0.2  0.0 - 1.0 mg/dL Final  . Nitrite 04/15/2015 NEGATIVE  NEGATIVE Final  . Leukocytes, UA 04/15/2015 NEGATIVE  NEGATIVE Final   MICROSCOPIC NOT DONE ON URINES WITH NEGATIVE PROTEIN, BLOOD, LEUKOCYTES, NITRITE, OR GLUCOSE <1000 mg/dL.  Marland Kitchen MRSA, PCR 04/15/2015 NEGATIVE  NEGATIVE Final  . Staphylococcus aureus 04/15/2015 NEGATIVE  NEGATIVE Final   Comment:        The Xpert SA Assay (FDA approved for NASAL specimens in patients over 43 years of age), is one component of a comprehensive surveillance program.  Test performance has been validated by [facility] for patients greater than or equal to 104 year old. It is not intended to diagnose infection nor to guide or monitor treatment.   . ABO/RH(D) 04/15/2015 A NEG   Final     X-Rays:No results found.  EKG:No orders found for this or any previous visit.   Hospital Course: Brandi Mosley is a 59 y.o. who was admitted to [facility]. They were brought to the operating room on 04/21/2015 and underwent Procedure(s): TOTAL KNEE ARTHROPLASTY.  Patient tolerated the procedure well and was later transferred to the recovery room and then to the orthopaedic floor for postoperative care.  They were given PO and IV analgesics for pain control following their surgery.  They were given 24 hours of postoperative antibiotics of  Anti-infectives    Start     Dose/Rate Route Frequency Ordered Stop   04/21/15 2000  ceFAZolin (ANCEF) IVPB 2 g/50 mL premix     2 g 100 mL/hr over 30 Minutes  Intravenous Every 6 hours 04/21/15 1730 04/22/15 0235   04/21/15 1051  ceFAZolin (ANCEF) IVPB 2 g/50 mL premix     2 g 100 mL/hr over 30 Minutes Intravenous On call to O.R. 04/21/15 1051 04/21/15 1342     and started on DVT prophylaxis in the form of Xarelto.   PT and OT were ordered for total joint protocol.  Social worker got involved to assist with placement of the the patient.  Discharge planning consulted to help with postop disposition and equipment needs.  Patient had a tough night on the evening of surgery but doing okay on the morning of day one.  They started to get up OOB with therapy on day one. Hemovac drain was pulled without difficulty.  Continued to work with therapy into day two.  Dressing was changed on day two and the incision was healing well.  Patient was seen in rounds by Dr. Wynelle Link and was ready to go to the SNF for further therapy.  Discharge to SNF Diet - Regular diet Follow up - in 2 weeks Activity - WBAT Disposition - Skilled nursing facility Condition Upon Discharge - Good D/C Meds - See DC Summary DVT Prophylaxis - Xarelto  Discharge Instructions    Call MD / Call 911    Complete by:  As directed   If you experience chest pain or shortness of breath, CALL 911 and be transported to the hospital emergency room.  If you develope a fever above 101 F, pus (white drainage) or increased drainage or redness at the wound, or calf pain, call your surgeon's office.     Change dressing    Complete by:  As directed  Change dressing daily with sterile 4 x 4 inch gauze dressing and apply TED hose. Do not submerge the incision under water.     Constipation Prevention    Complete by:  As directed   Drink plenty of fluids.  Prune juice may be helpful.  You may use a stool softener, such as Colace (over the counter) 100 mg twice a day.  Use MiraLax (over the counter) for constipation as needed.     Diet general    Complete by:  As directed      Discharge instructions     Complete by:  As directed   Pick up stool softner and laxative for home use following surgery while on pain medications. Do not submerge incision under water. Please use good hand washing techniques while changing dressing each day. May shower starting three days after surgery. Please use a clean towel to pat the incision dry following showers. Continue to use ice for pain and swelling after surgery. Do not use any lotions or creams on the incision until instructed by your surgeon.  Take Xarelto for two and a half more weeks, then discontinue Xarelto. Once the patient has completed the blood thinner regimen, then take a Baby 81 mg Aspirin daily for three more weeks.  Postoperative Constipation Protocol  Constipation - defined medically as fewer than three stools per week and severe constipation as less than one stool per week.  One of the most common issues patients have following surgery is constipation.  Even if you have a regular bowel pattern at home, your normal regimen is likely to be disrupted due to multiple reasons following surgery.  Combination of anesthesia, postoperative narcotics, change in appetite and fluid intake all can affect your bowels.  In order to avoid complications following surgery, here are some recommendations in order to help you during your recovery period.  Colace (docusate) - Pick up an over-the-counter form of Colace or another stool softener and take twice a day as long as you are requiring postoperative pain medications.  Take with a full glass of water daily.  If you experience loose stools or diarrhea, hold the colace until you stool forms back up.  If your symptoms do not get better within 1 week or if they get worse, check with your doctor.  Dulcolax (bisacodyl) - Pick up over-the-counter and take as directed by the product packaging as needed to assist with the movement of your bowels.  Take with a full glass of water.  Use this product as needed if not  relieved by Colace only.   MiraLax (polyethylene glycol) - Pick up over-the-counter to have on hand.  MiraLax is a solution that will increase the amount of water in your bowels to assist with bowel movements.  Take as directed and can mix with a glass of water, juice, soda, coffee, or tea.  Take if you go more than two days without a movement. Do not use MiraLax more than once per day. Call your doctor if you are still constipated or irregular after using this medication for 7 days in a row.  If you continue to have problems with postoperative constipation, please contact the office for further assistance and recommendations.  If you experience "the worst abdominal pain ever" or develop nausea or vomiting, please contact the office immediatly for further recommendations for treatment.  When discharged from the skilled rehab facility, please have the facility set up the patient's [place] prior to being released.  Please make sure this gets set up prior to release in order to avoid any lapse of therapy following the rehab stay.  Also provide the patient with their medications at time of release from the facility to include their pain medication, the muscle relaxants, and their blood thinner medication.  If the patient is still at the rehab facility at time of follow up appointment, please also assist the patient in arranging follow up appointment in our office and any transportation needs. ICE to the affected knee or hip every three hours for 30 minutes at a time and then as needed for pain and swelling.     Do not put a pillow under the knee. Place it under the heel.    Complete by:  As directed      Do not sit on low chairs, stoools or toilet seats, as it may be difficult to get up from low surfaces    Complete by:  As directed      Driving restrictions    Complete by:  As directed   No driving until released by the physician.     Increase activity slowly as tolerated     Complete by:  As directed      Lifting restrictions    Complete by:  As directed   No lifting until released by the physician.     Patient may shower    Complete by:  As directed   You may shower without a dressing once there is no drainage.  Do not wash over the wound.  If drainage remains, do not shower until drainage stops.     TED hose    Complete by:  As directed   Use stockings (TED hose) for 3 weeks on both leg(s).  You may remove them at night for sleeping.     Weight bearing as tolerated    Complete by:  As directed   Laterality:  right  Extremity:  Lower            Medication List    TAKE these medications        acetaminophen 325 MG tablet  Commonly known as:  TYLENOL  Take 2 tablets (650 mg total) by mouth every 6 (six) hours as needed for mild pain (or Fever >/= 101).     bisacodyl 10 MG suppository  Commonly known as:  DULCOLAX  Place 1 suppository (10 mg total) rectally daily as needed for moderate constipation.     diphenhydrAMINE 12.5 MG/5ML elixir  Commonly known as:  BENADRYL  Take 5-10 mLs (12.5-25 mg total) by mouth every 4 (four) hours as needed for itching.     docusate sodium 100 MG capsule  Commonly known as:  COLACE  Take 1 capsule (100 mg total) by mouth 2 (two) times daily.     levothyroxine 175 MCG tablet  Commonly known as:  SYNTHROID, LEVOTHROID  Take 175 mcg by mouth daily before breakfast. Uses brand name only     methocarbamol 500 MG tablet  Commonly known as:  ROBAXIN  Take 1 tablet (500 mg total) by mouth every 6 (six) hours as needed for muscle spasms.     metoCLOPramide 5 MG tablet  Commonly known as:  REGLAN  Take 1 tablet (5 mg total) by mouth every 8 (eight) hours as needed for nausea (if ondansetron (ZOFRAN) ineffective.).     ondansetron 4 MG tablet  Commonly known as:  ZOFRAN  Take 1 tablet (4 mg total) by mouth  every 6 (six) hours as needed for nausea.     oxyCODONE 5 MG immediate release tablet  Commonly known as:   Oxy IR/ROXICODONE  Take 1-2 tablets (5-10 mg total) by mouth every 3 (three) hours as needed for moderate pain or severe pain.     polyethylene glycol packet  Commonly known as:  MIRALAX / GLYCOLAX  Take 17 g by mouth daily as needed for mild constipation or moderate constipation.     rivaroxaban 10 MG Tabs tablet  Commonly known as:  XARELTO  Take 1 tablet (10 mg total) by mouth daily with breakfast. Take Xarelto for two and a half more weeks, then discontinue Xarelto. Once the patient has completed the blood thinner regimen, then take a Baby 81 mg Aspirin daily for three more weeks.     sodium phosphate 7-19 GM/118ML Enem  Place 133 mLs (1 enema total) rectally once as needed for moderate constipation or severe constipation.     traMADol 50 MG tablet  Commonly known as:  ULTRAM  Take 1-2 tablets (50-100 mg total) by mouth every 6 (six) hours as needed (mild pain).           Follow-up Information    Follow up with Gearlean Alf, MD. Schedule an appointment as soon as possible for a visit on 05/06/2015.   Specialty:  Orthopedic Surgery   Why:  Call office at [phone] to setup appointment on Tuesday 05/06/2015 with Dr. Wynelle Link.   Contact information:   [address] [place] [address] [phone]       Signed: Arlee Muslim, PA-C Orthopaedic Surgery 04/23/2015, 10:33 AM

## 2015-04-23 NOTE — Progress Notes (Signed)
   Subjective: 2 Days Post-Op Procedure(s) (LRB): TOTAL KNEE ARTHROPLASTY (Right) Patient reports pain as mild and moderate.   Patient seen in rounds with Dr. Wynelle Link.  Sitting up in chair. Patient is well, but has had some minor complaints of pain in the knee, requiring pain medications Patient is ready to go to the SNF for therapy.  Objective: Vital signs in last 24 hours: Temp:  [97.4 F (36.3 C)-98.5 F (36.9 C)] 98.2 F (36.8 C) (11/16 0451) Pulse Rate:  [60-66] 66 (11/16 0451) Resp:  [16-18] 18 (11/16 0451) BP: (86-101)/(47-57) 101/53 mmHg (11/16 0451) SpO2:  [97 %-99 %] 99 % (11/16 0451)  Intake/Output from previous day:  Intake/Output Summary (Last 24 hours) at 04/23/15 1025 Last data filed at 04/23/15 0452  Gross per 24 hour  Intake    720 ml  Output   2100 ml  Net  -1380 ml    Labs:  Recent Labs  04/22/15 0532 04/23/15 0425  HGB 12.0 10.1*    Recent Labs  04/22/15 0532 04/23/15 0425  WBC 14.8* 12.5*  RBC 3.69* 3.16*  HCT 35.3* 30.3*  PLT 239 206    Recent Labs  04/22/15 0532 04/23/15 0425  NA 137 137  K 4.3 3.7  CL 106 105  CO2 27 28  BUN 8 7  CREATININE 0.70 0.71  GLUCOSE 147* 109*  CALCIUM 8.6* 8.2*   No results for input(s): LABPT, INR in the last 72 hours.  EXAM: General - Patient is Alert, Appropriate and Oriented Extremity - Neurovascular intact Sensation intact distally Dorsiflexion/Plantar flexion intact Incision - clean, dry, scant drainage form hemovac site, Motor Function - intact, moving foot and toes well on exam.   Assessment/Plan: 2 Days Post-Op Procedure(s) (LRB): TOTAL KNEE ARTHROPLASTY (Right) Procedure(s) (LRB): TOTAL KNEE ARTHROPLASTY (Right) Past Medical History  Diagnosis Date  . Osteoarthritis     right knee  . Hypothyroidism   . Cancer [facility])     breast cancer on right    Principal Problem:   OA (osteoarthritis) of knee  Estimated body mass index is 22.81 kg/(m^2) as calculated from the  following:   Height as of this encounter: 5\' 10"  (1.778 m).   Weight as of this encounter: 72.122 kg (159 lb). Up with therapy Discharge to SNF Diet - Regular diet Follow up - in 2 weeks Activity - WBAT Disposition - Skilled nursing facility Condition Upon Discharge - Good D/C Meds - See DC Summary DVT Prophylaxis - Xarelto  Arlee Muslim, PA-C Orthopaedic Surgery 04/23/2015, 10:25 AM

## 2015-04-23 NOTE — Evaluation (Signed)
Occupational Therapy Evaluation Patient Details Name: Brandi Mosley MRN: [id number] DOB: 11/08/1955 Today's Date: 04/23/2015    History of Present Illness Pt is a 59 year old female s/p R TKA with hx of breast cancer   Clinical Impression   Pt was admitted for the above surgery. She will benefit from skilled OT to increase safety and independence with adls.  Pt currently needs min A overall and goals are set for supervision.  Pt lives alone and will benefit from SNF for rehab prior to home.  Pt is limited by pain    Follow Up Recommendations  SNF    Equipment Recommendations  3 in 1 bedside comode    Recommendations for Other Services       Precautions / Restrictions Precautions Precautions: Fall;Knee Required Braces or Orthoses: Knee Immobilizer - Right Knee Immobilizer - Right: Discontinue once straight leg raise with < 10 degree lag Restrictions Other Position/Activity Restrictions: WBAT      Mobility Bed Mobility   Bed Mobility: Supine to Sit     Supine to sit: Supervision     General bed mobility comments: crossed LLE under R to get to EOB  Transfers   Equipment used: Rolling walker (2 wheeled) Transfers: Sit to/from [employer] to Stand: Min guard Stand pivot transfers: Min guard       General transfer comment: for safety    Balance                                            ADL Overall ADL's : Needs assistance/impaired     Grooming: Set up;Sitting   Upper Body Bathing: Set up;Sitting   Lower Body Bathing: Minimal assistance;Sit to/from stand   Upper Body Dressing : Set up;Sitting   Lower Body Dressing: Minimal assistance;Sit to/from stand   Toilet Transfer: Min guard;Stand-pivot (to [employer])   Toileting- Water quality scientist and Hygiene: Min guard;Sit to/from stand         General ADL Comments: pt initially dizzy when sitting EOB but resolved quickly.  She is unable to lift RLE; painful  to cross LLE under R--cannot use sock aide at this time     Vision     Perception     Praxis      Pertinent Vitals/Pain Pain Score: 6  Pain Location: R knee Pain Descriptors / Indicators: Aching Pain Intervention(s): Limited activity within patient's tolerance;Monitored during session;Premedicated before session;Repositioned;Ice applied     Hand Dominance     Extremity/Trunk Assessment Upper Extremity Assessment Upper Extremity Assessment: Overall WFL for tasks assessed           Communication Communication Communication: No difficulties   Cognition Arousal/Alertness: Awake/alert Behavior During Therapy: WFL for tasks assessed/performed Overall Cognitive Status: Impaired/Different from baseline Area of Impairment: Safety/judgement               General Comments: pt started to stand without walker and without therapist in front when therapist was getting [place] Comments       Exercises       Shoulder Instructions      Home Living Family/patient expects to be discharged to:: Skilled nursing facility Living Arrangements: Alone  Prior Functioning/Environment Level of Independence: Independent             OT Diagnosis: Acute pain   OT Problem List: Decreased strength;Decreased activity tolerance;Pain;Decreased knowledge of use of DME or AE   OT Treatment/Interventions: Self-care/ADL training;DME and/or AE instruction;Patient/family education    OT Goals(Current goals can be found in the care plan section) Acute Rehab OT Goals Patient Stated Goal: decreased pain OT Goal Formulation: With patient Time For Goal Achievement: 04/30/15 Potential to Achieve Goals: Good ADL Goals Pt Will Perform Grooming: with supervision;standing Pt Will Perform Lower Body Bathing: with supervision;with adaptive equipment;sit to/from stand Pt Will Transfer to Toilet: with supervision;ambulating;bedside  commode Pt Will Perform Toileting - Clothing Manipulation and hygiene: with supervision;sit to/from stand  OT Frequency: Min 2X/week   Barriers to D/C:            Co-evaluation              End of Session    Activity Tolerance: Patient limited by pain Patient left: in chair;with call bell/phone within reach   Time: 0742-0803 OT Time Calculation (min): 21 min Charges:  OT General Charges $OT Visit: 1 Procedure OT Evaluation $Initial OT Evaluation Tier I: 1 Procedure G-Codes:    Malynda Smolinski 05-10-2015, 8:13 AM  Lesle Chris, OTR/L [phone] 05/10/2015

## 2015-04-23 NOTE — Clinical Social Work Placement (Signed)
   CLINICAL SOCIAL WORK PLACEMENT  NOTE  Date:  04/23/2015  Patient Details  Name: Brandi Mosley MRN: [id number] Date of Birth: [birth year]  Clinical Social Work is seeking post-discharge placement for this patient at the [place] level of care (*CSW will initial, date and re-position this form in  chart as items are completed):  No   Patient/family provided with [facility] Work Department's list of facilities offering this level of care within the geographic area requested by the patient (or if unable, by the patient's family).  Yes   Patient/family informed of their freedom to choose among providers that offer the needed level of care, that participate in Medicare, Medicaid or managed care program needed by the patient, have an available bed and are willing to accept the patient.  Yes   Patient/family informed of [facility]'s ownership interest in [facility] and [facility], as well as of the fact that they are under no obligation to receive care at these facilities.  PASRR submitted to EDS on 04/22/15     PASRR number received on 04/22/15     Existing PASRR number confirmed on       FL2 transmitted to all facilities in geographic area requested by pt/family on 04/22/15     FL2 transmitted to all facilities within larger geographic area on       Patient informed that his/her managed care company has contracts with or will negotiate with certain facilities, including the following:        Yes   Patient/family informed of bed offers received.  Patient chooses bed at [facility]     Physician recommends and patient chooses bed at [facility]    Patient to be transferred to [facility] on 04/23/15.  Patient to be transferred to facility by private vehicle     Patient family notified on 04/23/15 of transfer.  Name of family member notified:  pt notified at bedside     PHYSICIAN       Additional Comment:     _______________________________________________ Ladell Pier, LCSW 04/23/2015, 12:45 PM

## 2015-04-23 NOTE — Progress Notes (Signed)
Pt for discharge to [facility].   CSW facilitated pt discharge needs including contacting facility, faxing pt discharge information, discussing with pt at bedside, providing RN phone number to call report, providing discharge packet to RN to provide to pt upon discharge. Pt plans to transport via private vehicle.   No further social work needs identified at this time.  CSW signing off.   Alison Murray, MSW, LCSW Clinical Social Work Coverage for [employer], [place]

## 2015-04-24 ENCOUNTER — Other Ambulatory Visit (HOSPITAL_COMMUNITY): Payer: Self-pay

## 2015-04-24 ENCOUNTER — Non-Acute Institutional Stay (SKILLED_NURSING_FACILITY): Payer: PRIVATE HEALTH INSURANCE | Admitting: Adult Health

## 2015-04-24 ENCOUNTER — Encounter: Payer: Self-pay | Admitting: Adult Health

## 2015-04-24 DIAGNOSIS — E039 Hypothyroidism, unspecified: Secondary | ICD-10-CM

## 2015-04-24 DIAGNOSIS — M1711 Unilateral primary osteoarthritis, right knee: Secondary | ICD-10-CM | POA: Diagnosis not present

## 2015-04-24 DIAGNOSIS — D62 Acute posthemorrhagic anemia: Secondary | ICD-10-CM

## 2015-04-24 DIAGNOSIS — K5900 Constipation, unspecified: Secondary | ICD-10-CM | POA: Diagnosis not present

## 2015-04-24 NOTE — Progress Notes (Signed)
Patient ID: Brandi Mosley, female   DOB: 07-07-1955, 59 y.o.   MRN: [id number]    DATE:  04/24/2015   MRN:  [id number]  BIRTHDAY: 24-Jun-1955  Facility:  Nursing Home Location:  [place] Room Number: [phone]  LEVEL OF CARE:  SNF (31)  Contact Information    Name Relation Home Work [place] Friend [phone]         Chief Complaint  Patient presents with  . Hospitalization Follow-up    Osteoarthritis S/P right total knee arthroplasty, hypothyroidism, anemia and constipation    HISTORY OF PRESENT ILLNESS:  This is a 59 year old female who has been admitted to [facility] on 04/23/15 from [facility]. She has PMH of osteoarthritis of right knee, hypothyroidism and right breast cancer. She has osteoarthritis of right knee for which she had right total knee arthroplasty done on 04/21/15. She has been admitted for a total knee replacement.   PAST MEDICAL HISTORY:  Past Medical History  Diagnosis Date  . Osteoarthritis     right knee  . Hypothyroidism   . Cancer [facility])     breast cancer on right      CURRENT MEDICATIONS: Reviewed    Medication List       This list is accurate as of: 04/24/15  9:17 PM.  Always use your most recent med list.               acetaminophen 325 MG tablet  Commonly known as:  TYLENOL  Take 2 tablets (650 mg total) by mouth every 6 (six) hours as needed for mild pain (or Fever >/= 101).     bisacodyl 10 MG suppository  Commonly known as:  DULCOLAX  Place 1 suppository (10 mg total) rectally daily as needed for moderate constipation.     levothyroxine 175 MCG tablet  Commonly known as:  SYNTHROID, LEVOTHROID  Take 175 mcg by mouth daily before breakfast. Uses brand name only     methocarbamol 500 MG tablet  Commonly known as:  ROBAXIN  Take 1 tablet (500 mg total) by mouth every 6 (six) hours as needed for muscle spasms.     metoCLOPramide 5 MG tablet  Commonly known as:   REGLAN  Take 1 tablet (5 mg total) by mouth every 8 (eight) hours as needed for nausea (if ondansetron (ZOFRAN) ineffective.).     ondansetron 4 MG tablet  Commonly known as:  ZOFRAN  Take 1 tablet (4 mg total) by mouth every 6 (six) hours as needed for nausea.     oxyCODONE 5 MG immediate release tablet  Commonly known as:  Oxy IR/ROXICODONE  Take 1-2 tablets (5-10 mg total) by mouth every 3 (three) hours as needed for moderate pain or severe pain.     polyethylene glycol packet  Commonly known as:  MIRALAX / GLYCOLAX  Take 17 g by mouth daily as needed for mild constipation or moderate constipation.     rivaroxaban 10 MG Tabs tablet  Commonly known as:  XARELTO  Take 1 tablet (10 mg total) by mouth daily with breakfast. Take Xarelto for two and a half more weeks, then discontinue Xarelto. Once the patient has completed the blood thinner regimen, then take a Baby 81 mg Aspirin daily for three more weeks.     sennosides-docusate sodium 8.6-50 MG tablet  Commonly known as:  SENOKOT-S  Take 2 tablets by mouth 2 (two) times daily.     sodium phosphate  7-19 GM/118ML Enem  Place 133 mLs (1 enema total) rectally once as needed for moderate constipation or severe constipation.     traMADol 50 MG tablet  Commonly known as:  ULTRAM  Take 1-2 tablets (50-100 mg total) by mouth every 6 (six) hours as needed (mild pain).         No Known Allergies   REVIEW OF SYSTEMS:  GENERAL: no change in appetite, no fatigue, no weight changes, no fever, chills or weakness EYES: Denies change in vision, dry eyes, eye pain, itching or discharge EARS: Denies change in hearing, ringing in ears, or earache NOSE: Denies nasal congestion or epistaxis MOUTH and THROAT: Denies oral discomfort, gingival pain or bleeding, pain from teeth or hoarseness   RESPIRATORY: no cough, SOB, DOE, wheezing, hemoptysis CARDIAC: no chest pain, edema or palpitations GI: no abdominal pain, diarrhea, constipation, heart  burn, nausea or vomiting GU: Denies dysuria, frequency, hematuria, incontinence, or discharge PSYCHIATRIC: Denies feeling of depression or anxiety. No report of hallucinations, insomnia, paranoia, or agitation    PHYSICAL EXAMINATION  GENERAL APPEARANCE: Well nourished. In no acute distress. Normal body habitus SKIN:  Right knee surgical incision has Steri-Strips and dry dressing; no erythema HEAD: Normal in size and contour. No evidence of trauma EYES: Lids open and close normally. No blepharitis, entropion or ectropion. PERRL. Conjunctivae are clear and sclerae are white. Lenses are without opacity EARS: Pinnae are normal. Patient hears normal voice tunes of the examiner MOUTH and THROAT: Lips are without lesions. Oral mucosa is moist and without lesions. Tongue is normal in shape, size, and color and without lesions NECK: supple, trachea midline, no neck masses, no thyroid tenderness, no thyromegaly LYMPHATICS: no LAN in the neck, no supraclavicular LAN RESPIRATORY: breathing is even & unlabored, BS CTAB CARDIAC: RRR, no murmur,no extra heart sounds, no edema GI: abdomen soft, normal BS, no masses, no tenderness, no hepatomegaly, no splenomegaly EXTREMITIES:  Able to move 4 extremities PSYCHIATRIC: Alert and oriented X 3. Affect and behavior are appropriate  LABS/RADIOLOGY: Labs reviewed: Basic Metabolic Panel:  Recent Labs  04/15/15 0830 04/22/15 0532 04/23/15 0425  NA 139 137 137  K 4.1 4.3 3.7  CL 101 106 105  CO2 30 27 28   GLUCOSE 117* 147* 109*  BUN 16 8 7   CREATININE 0.78 0.70 0.71  CALCIUM 9.7 8.6* 8.2*   Liver Function Tests:  Recent Labs  04/15/15 0830  AST 30  ALT 24  ALKPHOS 52  BILITOT 0.7  PROT 7.2  ALBUMIN 4.6   CBC:  Recent Labs  04/15/15 0830 04/22/15 0532 04/23/15 0425  WBC 6.3 14.8* 12.5*  HGB 14.3 12.0 10.1*  HCT 43.7 35.3* 30.3*  MCV 97.3 95.7 95.9  PLT 299 239 206     ASSESSMENT/PLAN:  Osteoarthritis S/P right total knee  arthroplasty - for rehabilitation; WBAT; continue Tylenol 325 mg take 2 tabs = ex 150 mg by mouth every 6 hours when necessary, tramadol 50 mg 1-2 tabs by mouth every 6 hours when necessary and oxycodone 5 mg 1-2 tabs by mouth every 3 hours when necessary for pain; Xarelto 10 mg 1 tab by mouth daily 2  1/2 weeks then aspirin 81 mg by mouth daily 3 weeks for DVT prophylaxis; follow-up with Dr. Wynelle Link, orthopedic surgeon, on 05/06/15  Hypothyroidism - continue Synthroid 175 g 1 tab by mouth daily; check TSH  Anemia, acute blood loss - hemoglobin 10.1; check CBC  Constipation - discontinue Colace and start senna S2 tabs by mouth  twice a day, change MiraLAX to 17 g by mouth daily and Dulcolax suppository when necessary    Goals of care:  Short-term rehabilitation    [facility], [place]

## 2015-04-25 ENCOUNTER — Non-Acute Institutional Stay (SKILLED_NURSING_FACILITY): Payer: PRIVATE HEALTH INSURANCE | Admitting: Internal Medicine

## 2015-04-25 DIAGNOSIS — R2681 Unsteadiness on feet: Secondary | ICD-10-CM

## 2015-04-25 DIAGNOSIS — D62 Acute posthemorrhagic anemia: Secondary | ICD-10-CM

## 2015-04-25 DIAGNOSIS — E039 Hypothyroidism, unspecified: Secondary | ICD-10-CM

## 2015-04-25 DIAGNOSIS — K5900 Constipation, unspecified: Secondary | ICD-10-CM | POA: Diagnosis not present

## 2015-04-25 DIAGNOSIS — M1711 Unilateral primary osteoarthritis, right knee: Secondary | ICD-10-CM

## 2015-04-25 DIAGNOSIS — R509 Fever, unspecified: Secondary | ICD-10-CM | POA: Diagnosis not present

## 2015-04-26 NOTE — Progress Notes (Signed)
Patient ID: Brandi Mosley, female   DOB: July 06, 1955, 59 y.o.   MRN: [id number]     [facility]   PCP: Osborne Casco, MD  Code status: DNR  No Known Allergies  Chief Complaint  Patient presents with  . New Admit To SNF     HPI:  59 y.o. patient is here for short term rehabilitation post hospital admission from 04/21/15-04/23/15 with right knee OA. She underwent right total knee arthroplasty. She is seen in her room today. Her pain is not under control at present. On further review, patient was not aware of her pain medication being written for on as needed basis. She mentions spiking a temperature of 101 last night. Per nursing staff, no documentation of this. She had been constipated and was given senna s and miralax yesterday. She had a bowel movement today and feels better.   Review of Systems:  Constitutional: Negative for fever, chills, diaphoresis at present  HENT: Negative for congestion, nasal discharge.   Eyes: Negative for eye pain, blurred vision, double vision and discharge.  Respiratory: Negative for cough, shortness of breath and wheezing.   Cardiovascular: Negative for chest pain, palpitations.  Gastrointestinal: Negative for heartburn, nausea, vomiting, abdominal pain. Genitourinary: Negative for dysuria, flank pain.  Musculoskeletal: Negative for back pain, falls. Skin: Negative for itching, rash.  Neurological: positive for occasional lightheadedness. Negative for  tingling, focal weakness Psychiatric/Behavioral: Negative for depression.    Past Medical History  Diagnosis Date  . Osteoarthritis     right knee  . Hypothyroidism   . Cancer [facility])     breast cancer on right    Past Surgical History  Procedure Laterality Date  . Breast surgery    . Laparoscopic oopherectomy      in 2009  . Knee arthroscopy Bilateral   . Total knee arthroplasty Right 04/21/2015    Procedure: TOTAL KNEE ARTHROPLASTY;  Surgeon:  Gaynelle Arabian, MD;  Location: [facility]S;  Service: Orthopedics;  Laterality: Right;   Social History:   reports that she has quit smoking. She has never used smokeless tobacco. She reports that she drinks about 1.8 - 3.0 oz of alcohol per week. She reports that she does not use illicit drugs.  Family History  Problem Relation Age of Onset  . Cancer Mother   . Cancer Father   . Hyperlipidemia Father   . Hypertension Father   . Cancer Sister   . Cancer Maternal Grandmother     Medications:   Medication List       This list is accurate as of: 04/25/15 11:59 PM.  Always use your most recent med list.               acetaminophen 325 MG tablet  Commonly known as:  TYLENOL  Take 2 tablets (650 mg total) by mouth every 6 (six) hours as needed for mild pain (or Fever >/= 101).     bisacodyl 10 MG suppository  Commonly known as:  DULCOLAX  Place 1 suppository (10 mg total) rectally daily as needed for moderate constipation.     levothyroxine 175 MCG tablet  Commonly known as:  SYNTHROID, LEVOTHROID  Take 175 mcg by mouth daily before breakfast. Uses brand name only     methocarbamol 500 MG tablet  Commonly known as:  ROBAXIN  Take 1 tablet (500 mg total) by mouth every 6 (six) hours as needed for muscle spasms.     metoCLOPramide 5 MG tablet  Commonly  known as:  REGLAN  Take 1 tablet (5 mg total) by mouth every 8 (eight) hours as needed for nausea (if ondansetron (ZOFRAN) ineffective.).     ondansetron 4 MG tablet  Commonly known as:  ZOFRAN  Take 1 tablet (4 mg total) by mouth every 6 (six) hours as needed for nausea.     oxyCODONE 5 MG immediate release tablet  Commonly known as:  Oxy IR/ROXICODONE  Take 1-2 tablets (5-10 mg total) by mouth every 3 (three) hours as needed for moderate pain or severe pain.     polyethylene glycol packet  Commonly known as:  MIRALAX / GLYCOLAX  Take 17 g by mouth daily as needed for mild constipation or moderate constipation.      rivaroxaban 10 MG Tabs tablet  Commonly known as:  XARELTO  Take 1 tablet (10 mg total) by mouth daily with breakfast. Take Xarelto for two and a half more weeks, then discontinue Xarelto. Once the patient has completed the blood thinner regimen, then take a Baby 81 mg Aspirin daily for three more weeks.     sennosides-docusate sodium 8.6-50 MG tablet  Commonly known as:  SENOKOT-S  Take 2 tablets by mouth 2 (two) times daily.     sodium phosphate 7-19 GM/118ML Enem  Place 133 mLs (1 enema total) rectally once as needed for moderate constipation or severe constipation.     traMADol 50 MG tablet  Commonly known as:  ULTRAM  Take 1-2 tablets (50-100 mg total) by mouth every 6 (six) hours as needed (mild pain).         Physical Exam: Filed Vitals:   04/25/15 1504  BP: 118/88  Pulse: 72  Temp: 97.7 F (36.5 C)  Resp: 18  SpO2: 93%    General- adult female, in no acute distress Head- normocephalic, atraumatic Nose- normal nasal mucosa, no maxillary or frontal sinus tenderness, no nasal discharge Throat- moist mucus membrane  Eyes- PERRLA, EOMI, no pallor, no icterus, no discharge, normal conjunctiva, normal sclera Neck- no cervical lymphadenopathy Cardiovascular- normal s1,s2, no murmurs, palpable dorsalis pedis and radial pulses, 1+ right leg edema, ted hose + Respiratory- bilateral clear to auscultation, no wheeze, no rhonchi, no crackles, no use of accessory muscles Abdomen- bowel sounds present, soft, non tender Musculoskeletal- able to move all 4 extremities, right leg ROM limited at knee Neurological- no focal deficit, alert and oriented to person, place and time Skin- warm and dry, right knee surgical incision area with steri strips , has mild peri-incisional erythema with mild medial swelling, no drainage, drain site healing well Psychiatry- normal mood and affect    Labs reviewed: Basic Metabolic Panel:  Recent Labs  04/15/15 0830 04/22/15 0532 04/23/15 0425    NA 139 137 137  K 4.1 4.3 3.7  CL 101 106 105  CO2 30 27 28   GLUCOSE 117* 147* 109*  BUN 16 8 7   CREATININE 0.78 0.70 0.71  CALCIUM 9.7 8.6* 8.2*   Liver Function Tests:  Recent Labs  04/15/15 0830  AST 30  ALT 24  ALKPHOS 52  BILITOT 0.7  PROT 7.2  ALBUMIN 4.6   CBC:  Recent Labs  04/15/15 0830 04/22/15 0532 04/23/15 0425  WBC 6.3 14.8* 12.5*  HGB 14.3 12.0 10.1*  HCT 43.7 35.3* 30.3*  MCV 97.3 95.7 95.9  PLT 299 239 206     Assessment/Plan  Unsteady gait Post right knee replacement surgery, Will have her work with physical therapy and occupational therapy team to help  with gait training and muscle strengthening exercises.fall precautions. Skin care. Encourage to be out of bed.   Right knee osteoarthritis  S/P right total knee arthroplasty. Will have patient work with PT/OT as tolerated to regain strength and restore function.  Fall precautions are in place. Has f/u with orthopedics. Continue oxyIR 5 mg 1-2 tab q3h prn pain, advised to ask for it every 3 hours if needed. Also on tramadol and tylenol for breakthrough pain. Continue xarelto for dvt prophylaxis. Continue to wear ted hose.  Continue robaxin 500 mg q6h prn muscle spasm.  Fever Afebrile at present. No documentation of temp spike in nursing records. Will get vital signs checked q shift. Has mild peri-incision erythema and swelling. No drainage at present. Monitor for signs of infection during dressing change. Check cbc with diff.   Blood loss anemia Post op, monitor h&h  Constipation Continue senna s but change this to 2 tab qhs from bid to avoid loose stool. Also change miralax to daily prn and monitor. Hydration encouraged  Hypothyroidism continue Synthroid 175 mcg daily, tsh check   Goals of care: short term rehabilitation   Labs/tests ordered: cbc, bmp, tsh  Family/ staff Communication: reviewed care plan with patient and nursing supervisor    Blanchie Serve, MD  [facility] [phone] (Monday-Friday 8 am - 5 pm) [phone] (afterhours)

## 2015-04-29 ENCOUNTER — Encounter: Payer: Self-pay | Admitting: Adult Health

## 2015-05-05 ENCOUNTER — Inpatient Hospital Stay (HOSPITAL_COMMUNITY): Admit: 2015-05-05 | Payer: No Typology Code available for payment source | Admitting: Orthopedic Surgery

## 2015-05-05 ENCOUNTER — Encounter (HOSPITAL_COMMUNITY): Payer: Self-pay

## 2015-05-05 SURGERY — ARTHROPLASTY, KNEE, TOTAL
Anesthesia: General | Laterality: Right

## 2016-09-09 NOTE — Progress Notes (Signed)
This encounter was created in error - please disregard.

## 2017-12-07 ENCOUNTER — Other Ambulatory Visit (HOSPITAL_COMMUNITY): Payer: Self-pay | Admitting: Orthopedic Surgery

## 2017-12-07 DIAGNOSIS — M25561 Pain in right knee: Secondary | ICD-10-CM

## 2018-04-25 ENCOUNTER — Emergency Department (HOSPITAL_COMMUNITY)
Admission: EM | Admit: 2018-04-25 | Discharge: 2018-04-25 | Disposition: A | Discharge status: Home / Self Care | Payer: No Typology Code available for payment source | Attending: Emergency Medicine | Admitting: Emergency Medicine

## 2018-04-25 ENCOUNTER — Emergency Department (HOSPITAL_COMMUNITY): Payer: No Typology Code available for payment source

## 2018-04-25 ENCOUNTER — Encounter (HOSPITAL_COMMUNITY): Payer: Self-pay

## 2018-04-25 ENCOUNTER — Other Ambulatory Visit: Payer: Self-pay

## 2018-04-25 DIAGNOSIS — Z79899 Other long term (current) drug therapy: Secondary | ICD-10-CM | POA: Diagnosis not present

## 2018-04-25 DIAGNOSIS — R0602 Shortness of breath: Secondary | ICD-10-CM | POA: Insufficient documentation

## 2018-04-25 DIAGNOSIS — E039 Hypothyroidism, unspecified: Secondary | ICD-10-CM | POA: Insufficient documentation

## 2018-04-25 DIAGNOSIS — Z853 Personal history of malignant neoplasm of breast: Secondary | ICD-10-CM | POA: Diagnosis not present

## 2018-04-25 DIAGNOSIS — Z7901 Long term (current) use of anticoagulants: Secondary | ICD-10-CM | POA: Diagnosis not present

## 2018-04-25 DIAGNOSIS — R0789 Other chest pain: Secondary | ICD-10-CM | POA: Diagnosis not present

## 2018-04-25 DIAGNOSIS — Z87891 Personal history of nicotine dependence: Secondary | ICD-10-CM | POA: Diagnosis not present

## 2018-04-25 LAB — BASIC METABOLIC PANEL
ANION GAP: 8 (ref 5–15)
BUN: 13 mg/dL (ref 8–23)
CALCIUM: 8.8 mg/dL — AB (ref 8.9–10.3)
CO2: 24 mmol/L (ref 22–32)
CREATININE: 0.86 mg/dL (ref 0.44–1.00)
Chloride: 107 mmol/L (ref 98–111)
GFR calc Af Amer: >60 mL/min (ref >60)
GFR calc non Af Amer: >60 mL/min (ref >60)
GLUCOSE: 109 mg/dL — AB (ref 70–99)
Potassium: 3.3 mmol/L — ABNORMAL LOW (ref 3.5–5.1)
SODIUM: 139 mmol/L (ref 135–145)

## 2018-04-25 LAB — CBC WITH DIFFERENTIAL/PLATELET
Abs Immature Granulocytes: 0.01 10*3/uL (ref 0.00–0.07)
BASOS ABS: 0 10*3/uL (ref 0.0–0.1)
BASOS PCT: 1 %
EOS ABS: 0.3 10*3/uL (ref 0.0–0.5)
Eosinophils Relative: 4 %
HCT: 42.9 % (ref 36.0–46.0)
Hemoglobin: 13.3 g/dL (ref 12.0–15.0)
IMMATURE GRANULOCYTES: 0 %
Lymphocytes Relative: 26 %
Lymphs Abs: 1.6 10*3/uL (ref 0.7–4.0)
MCH: 31.2 pg (ref 26.0–34.0)
MCHC: 31 g/dL (ref 30.0–36.0)
MCV: 100.7 fL — AB (ref 80.0–100.0)
Monocytes Absolute: 0.6 10*3/uL (ref 0.1–1.0)
Monocytes Relative: 10 %
NEUTROS ABS: 3.6 10*3/uL (ref 1.7–7.7)
NRBC: 0 % (ref 0.0–0.2)
Neutrophils Relative %: 59 %
PLATELETS: 259 10*3/uL (ref 150–400)
RBC: 4.26 MIL/uL (ref 3.87–5.11)
RDW: 13.3 % (ref 11.5–15.5)
WBC: 6.1 10*3/uL (ref 4.0–10.5)

## 2018-04-25 LAB — D-DIMER, QUANTITATIVE (NOT AT ARMC)

## 2018-04-25 LAB — I-STAT TROPONIN, ED
TROPONIN I, POC: 0.01 ng/mL (ref 0.00–0.08)
Troponin i, poc: 0 ng/mL (ref 0.00–0.08)

## 2018-04-25 NOTE — ED Triage Notes (Signed)
Pt arrived via [facility] from [facility] office after sudden onset CP on left side 6/10 radiating to back and throat described as squeezing pressure. Lightheadedness and nausea also noted at the time. Given 324 aspirin and 1 SL NTG and pain has now subsided, however pt states still feels uncomfortable when she takes deep breath. No pertinent medical Hx other than hypotension.  Otherwise active and healthy.

## 2018-04-25 NOTE — Discharge Instructions (Signed)
Take 4 over the counter ibuprofen tablets 3 times a day or 2 over-the-counter naproxen tablets twice a day for pain. Also take tylenol 1000mg(2 extra strength) four times a day.    

## 2018-04-25 NOTE — ED Provider Notes (Signed)
[place] EMERGENCY DEPARTMENT Provider Note   CSN: [id number] Arrival date & time: 04/25/18  1214     History   Chief Complaint Chief Complaint  Patient presents with  . Chest Pain    HPI Brandi Mosley is a 62 y.o. female.  62 yo F with a chief complaint of left-sided sharp chest pain.  This started when she woke up this morning.  She points to an area underneath her left breast.  Seems to come and go.  Occurred on exertion and radiated down her left arm.  She felt she would have to stop and try to catch her breath.  Then resolved.  Lasted for a short time every time it occurred.  She went and saw her family physician had pain when she arrived and she was sent via EMS here.  She denies hypertension hyperlipidemia diabetes smoking or family history of MI.  She denies hemoptysis history of PE or DVT unilateral lower extremity edema  The history is provided by the patient.  Chest Pain   This is a new problem. The current episode started 2 days ago. The problem occurs constantly. The problem has not changed since onset.The pain is present in the substernal region. The pain is at a severity of 4/10. The pain is mild. The quality of the pain is described as brief, sharp and pleuritic. The pain radiates to the left arm. Duration of episode(s) is 1 day. The symptoms are aggravated by certain positions. Associated symptoms include shortness of breath. Pertinent negatives include no dizziness, no fever, no headaches, no nausea, no palpitations and no vomiting. She has tried nothing for the symptoms. The treatment provided no relief.  Pertinent negatives for past medical history include no diabetes, no DVT, no hyperlipidemia, no hypertension, no MI and no PE.  Pertinent negatives for family medical history include: no early MI.    Past Medical History:  Diagnosis Date  . Cancer [facility])    breast cancer on right   . Hypothyroidism   . Osteoarthritis    right knee     Patient Active Problem List   Diagnosis Date Noted  . OA (osteoarthritis) of knee 04/21/2015  . Hypothyroidism 11/29/2014    Past Surgical History:  Procedure Laterality Date  . BREAST SURGERY    . KNEE ARTHROSCOPY Bilateral   . LAPAROSCOPIC OOPHERECTOMY     in 2009  . 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 Authorizing Provider  Ascorbic Acid (VITAMIN C) 1000 MG tablet Take 3,000 mg by mouth daily.   Yes [provider]  b complex vitamins tablet Take 1 tablet by mouth daily.   Yes [provider]  levothyroxine (SYNTHROID, LEVOTHROID) 175 MCG tablet Take 175 mcg by mouth daily before breakfast. Uses brand name only   Yes [provider]  Multiple Vitamin (MULTIVITAMIN) tablet Take 1 tablet by mouth daily.   Yes [provider]  Omega 3 1200 MG CAPS Take 2,400 mg by mouth daily.   Yes [provider]  acetaminophen (TYLENOL) 325 MG tablet Take 2 tablets (650 mg total) by mouth every 6 (six) hours as needed for mild pain (or Fever >/= 101). Patient not taking:  Reported on 04/25/2018 04/23/15   Perkins, Alexzandrew L, PA-C  bisacodyl (DULCOLAX) 10 MG suppository Place 1 suppository (10 mg total) rectally daily as needed for moderate constipation. Patient not taking: Reported on 04/25/2018 04/23/15   Dara Lords, Alexzandrew L, PA-C  methocarbamol (ROBAXIN) 500 MG tablet Take 1 tablet (500 mg total) by mouth every 6 (six) hours as needed for muscle spasms. Patient not taking: Reported on 04/25/2018 04/23/15   Dara Lords, Alexzandrew L, PA-C  metoCLOPramide (REGLAN) 5 MG tablet Take 1 tablet (5 mg total) by mouth every 8 (eight) hours as needed for nausea (if ondansetron (ZOFRAN)  ineffective.). Patient not taking: Reported on 04/25/2018 04/23/15   Perkins, Alexzandrew L, PA-C  ondansetron (ZOFRAN) 4 MG tablet Take 1 tablet (4 mg total) by mouth every 6 (six) hours as needed for nausea. Patient not taking: Reported on 04/25/2018 04/23/15   Perkins, Alexzandrew L, PA-C  oxyCODONE (OXY IR/ROXICODONE) 5 MG immediate release tablet Take 1-2 tablets (5-10 mg total) by mouth every 3 (three) hours as needed for moderate pain or severe pain. Patient not taking: Reported on 04/25/2018 04/23/15   Perkins, Alexzandrew L, PA-C  polyethylene glycol (MIRALAX / GLYCOLAX) packet Take 17 g by mouth daily as needed for mild constipation or moderate constipation. Patient not taking: Reported on 04/25/2018 04/23/15   Dara Lords, Alexzandrew L, PA-C  rivaroxaban (XARELTO) 10 MG TABS tablet Take 1 tablet (10 mg total) by mouth daily with breakfast. Take Xarelto for two and a half more weeks, then discontinue Xarelto. Once the patient has completed the blood thinner regimen, then take a Baby 81 mg Aspirin daily for three more weeks. Patient not taking: Reported on 04/25/2018 04/23/15   Dara Lords, Alexzandrew L, PA-C  sodium phosphate (FLEET) 7-19 GM/118ML ENEM Place 133 mLs (1 enema total) rectally once as needed for moderate constipation or severe constipation. Patient not taking: Reported on 04/25/2018 04/23/15   Dara Lords, Alexzandrew L, PA-C  traMADol (ULTRAM) 50 MG tablet Take 1-2 tablets (50-100 mg total) by mouth every 6 (six) hours as needed (mild pain). Patient not taking: Reported on 04/25/2018 04/23/15   Joelene Millin, PA-C    Family History Family History  Problem Relation Age of Onset  . Cancer Mother   . Cancer Father   . Hyperlipidemia Father   . Hypertension Father   . Cancer Sister   . Cancer Maternal Grandmother     Social History Social History   Tobacco Use  . Smoking status: Former Research scientist (life sciences)  . Smokeless tobacco: Never Used  Substance Use Topics  . Alcohol use:  Yes    Alcohol/week: 3.0 - 5.0 standard drinks    Types: 3 - 5 Glasses of wine per week  . Drug use: No     Allergies   Patient has no known allergies.   Review of Systems Review of Systems  Constitutional: Negative for chills and fever.  HENT: Negative for congestion and rhinorrhea.   Eyes: Negative for redness and visual disturbance.  Respiratory: Positive for shortness of breath. Negative for wheezing.   Cardiovascular: Positive for chest pain. Negative for palpitations.  Gastrointestinal: Negative for nausea and vomiting.  Genitourinary: Negative for dysuria and urgency.  Musculoskeletal: Negative for arthralgias and myalgias.  Skin: Negative for pallor and wound.  Neurological: Negative for dizziness and headaches.     Physical Exam Updated Vital Signs BP 117/75   Pulse 60   Temp 98 F (36.7 C) (Oral)   Resp 14   SpO2 97%   Physical Exam  Constitutional: She is oriented to person, place, and time. She appears well-developed and well-nourished. No distress.  HENT:  Head: Normocephalic and atraumatic.  Eyes: Pupils are equal, round, and reactive to light. EOM are normal.  Neck: Normal range of motion. Neck supple.  Cardiovascular: Normal rate and regular rhythm. Exam reveals no gallop and no friction rub.  No murmur heard. Pulmonary/Chest: Effort normal. She has no wheezes. She has no rales. She exhibits tenderness (she felt uncomfortable with palpation but no overt pain).  Abdominal: Soft. She exhibits no distension and no mass. There is no tenderness. There is no guarding.  Musculoskeletal: She exhibits no edema or tenderness.  Neurological: She is alert and oriented to person, place, and time.  Skin: Skin is warm and dry. She is not diaphoretic.  Psychiatric: She has a normal mood and affect. Her behavior is normal.  Nursing note and vitals reviewed.    ED Treatments / Results  Labs (all labs ordered are listed, but only abnormal results are  displayed) Labs Reviewed  CBC WITH DIFFERENTIAL/PLATELET - Abnormal; Notable for the following components:      Result Value   MCV 100.7 (*)    All other components within normal limits  BASIC METABOLIC PANEL - Abnormal; Notable for the following components:   Potassium 3.3 (*)    Glucose, Bld 109 (*)    Calcium 8.8 (*)    All other components within normal limits  D-DIMER, QUANTITATIVE (NOT AT [facility])  I-STAT TROPONIN, ED  I-STAT TROPONIN, ED    EKG EKG Interpretation  Date/Time:  Tuesday April 25 2018 12:17:45 EST Ventricular Rate:  57 PR Interval:    QRS Duration: 89 QT Interval:  451 QTC Calculation: 440 R Axis:   15 Text Interpretation:  Sinus rhythm No old tracing to compare Confirmed by Deno Etienne [phone]) on 04/25/2018 12:53:47 PM   Radiology Dg Chest 2 View  Result Date: 04/25/2018 CLINICAL DATA:  Assess chest pain.  Tightness.  Left arm pressure. EXAM: CHEST - 2 VIEW COMPARISON:  Chest x-ray 11/29/2014. FINDINGS: Mediastinum and hilar structures normal. Lungs are clear of acute infiltrates. No pleural effusion or pneumothorax. Heart size normal. Surgical clips right axilla. No acute bony abnormality. IMPRESSION: No acute cardiopulmonary disease. Electronically Signed   By: Marcello Moores  Register   On: 04/25/2018 13:16    Procedures Procedures (including critical care time)  Medications Ordered in ED Medications - No data to display   Initial Impression / Assessment and Plan / ED Course  I have reviewed the triage vital signs and the nursing notes.  Pertinent labs & imaging results that were available during my care of the patient were reviewed by me and considered in my medical decision making (see chart for details).     62 yo F with atypical chest pain.  Not reproduced on exam.  Initial trop negative, ddimer negative.  CXR viewed by me without ptx. Will delta.    Signed out to Dr. Maryan Rued, please see her note for further details of care in the ED.   The  patients results and plan were reviewed and discussed.   Any x-rays performed were independently reviewed by myself.   Differential diagnosis were considered with the presenting HPI.  Medications - No data to display  Vitals:   04/25/18 1245 04/25/18 1300 04/25/18 1315 04/25/18 1330  BP: 117/72 107/70 114/74 117/75  Pulse: 66 62 (!) 59 60  Resp: 16 12 18 14   Temp:      TempSrc:  SpO2: (!) 87% 98% 100% 97%    Final diagnoses:  Atypical chest pain    Admission/ observation were discussed with the admitting physician, patient and/or family and they are comfortable with the plan.    Final Clinical Impressions(s) / ED Diagnoses   Final diagnoses:  Atypical chest pain    ED Discharge Orders    None       Deno Etienne, DO 04/25/18 1554

## 2018-04-25 NOTE — ED Provider Notes (Signed)
Delta trop neg.  Pt will be d/ced home to f/u with Dennison Nancy, MD 04/25/18 1712

## 2018-06-16 ENCOUNTER — Other Ambulatory Visit (HOSPITAL_COMMUNITY)
Admission: RE | Admit: 2018-06-16 | Discharge: 2018-06-16 | Disposition: A | Discharge status: Home / Self Care | Payer: No Typology Code available for payment source | Source: Ambulatory Visit | Attending: Family Medicine | Admitting: Family Medicine

## 2018-06-16 ENCOUNTER — Other Ambulatory Visit: Payer: Self-pay | Admitting: Family Medicine

## 2018-06-16 DIAGNOSIS — Z01419 Encounter for gynecological examination (general) (routine) without abnormal findings: Secondary | ICD-10-CM | POA: Insufficient documentation

## 2018-06-20 LAB — CYTOLOGY - PAP: DIAGNOSIS: NEGATIVE

## 2019-05-01 ENCOUNTER — Other Ambulatory Visit: Payer: Self-pay

## 2019-05-01 DIAGNOSIS — Z20822 Contact with and (suspected) exposure to covid-19: Secondary | ICD-10-CM

## 2019-05-03 LAB — NOVEL CORONAVIRUS, NAA: SARS-CoV-2, NAA: NOT DETECTED

## 2019-10-27 IMAGING — DX DG CHEST 2V
2 series · 2 of 2 positions shown · non-contrast
Comparison: Chest x-ray 11/29/2014.

CLINICAL DATA: Assess chest pain.  Tightness.  Left arm pressure.

EXAM:
CHEST - 2 VIEW

[w chest pa]
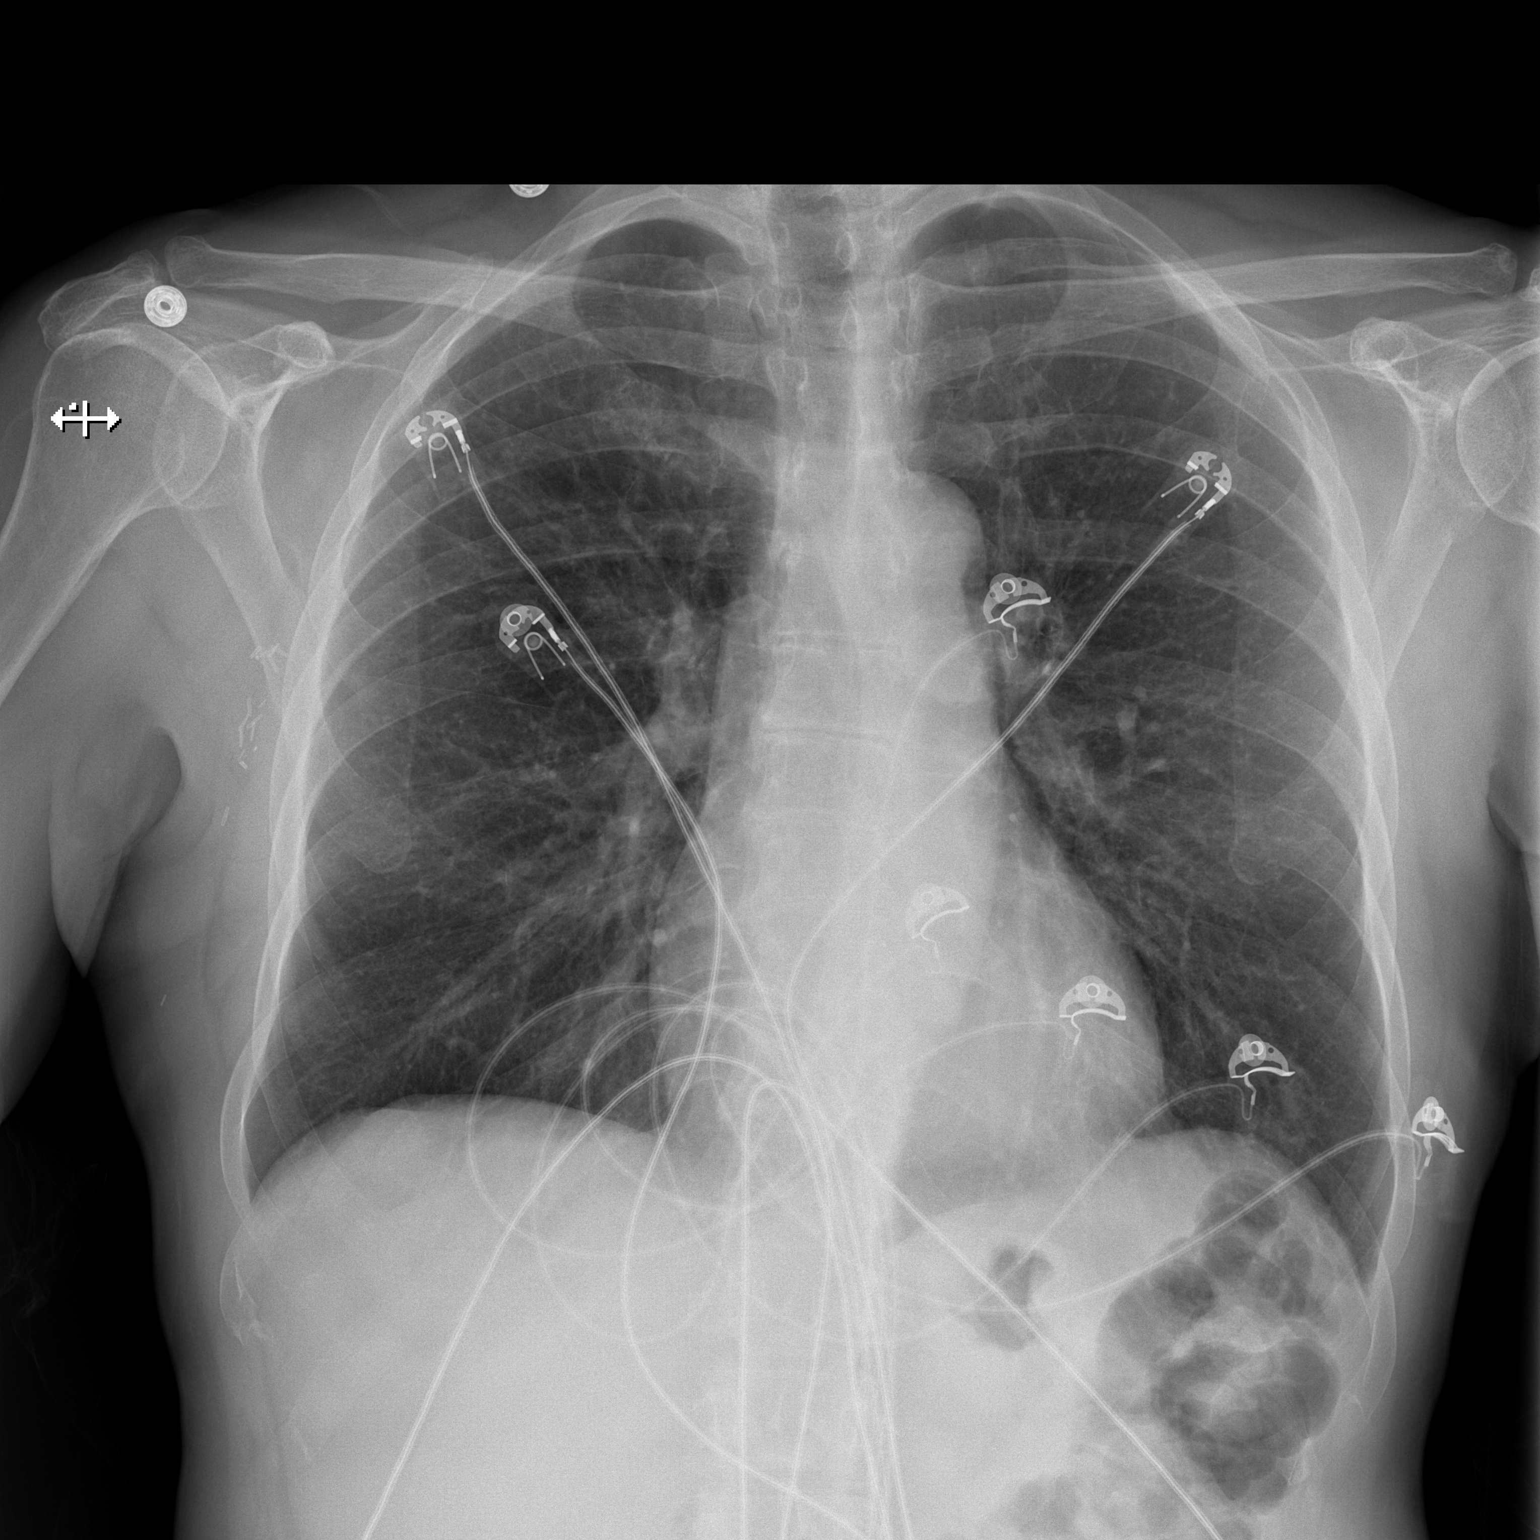

[w chest lat]
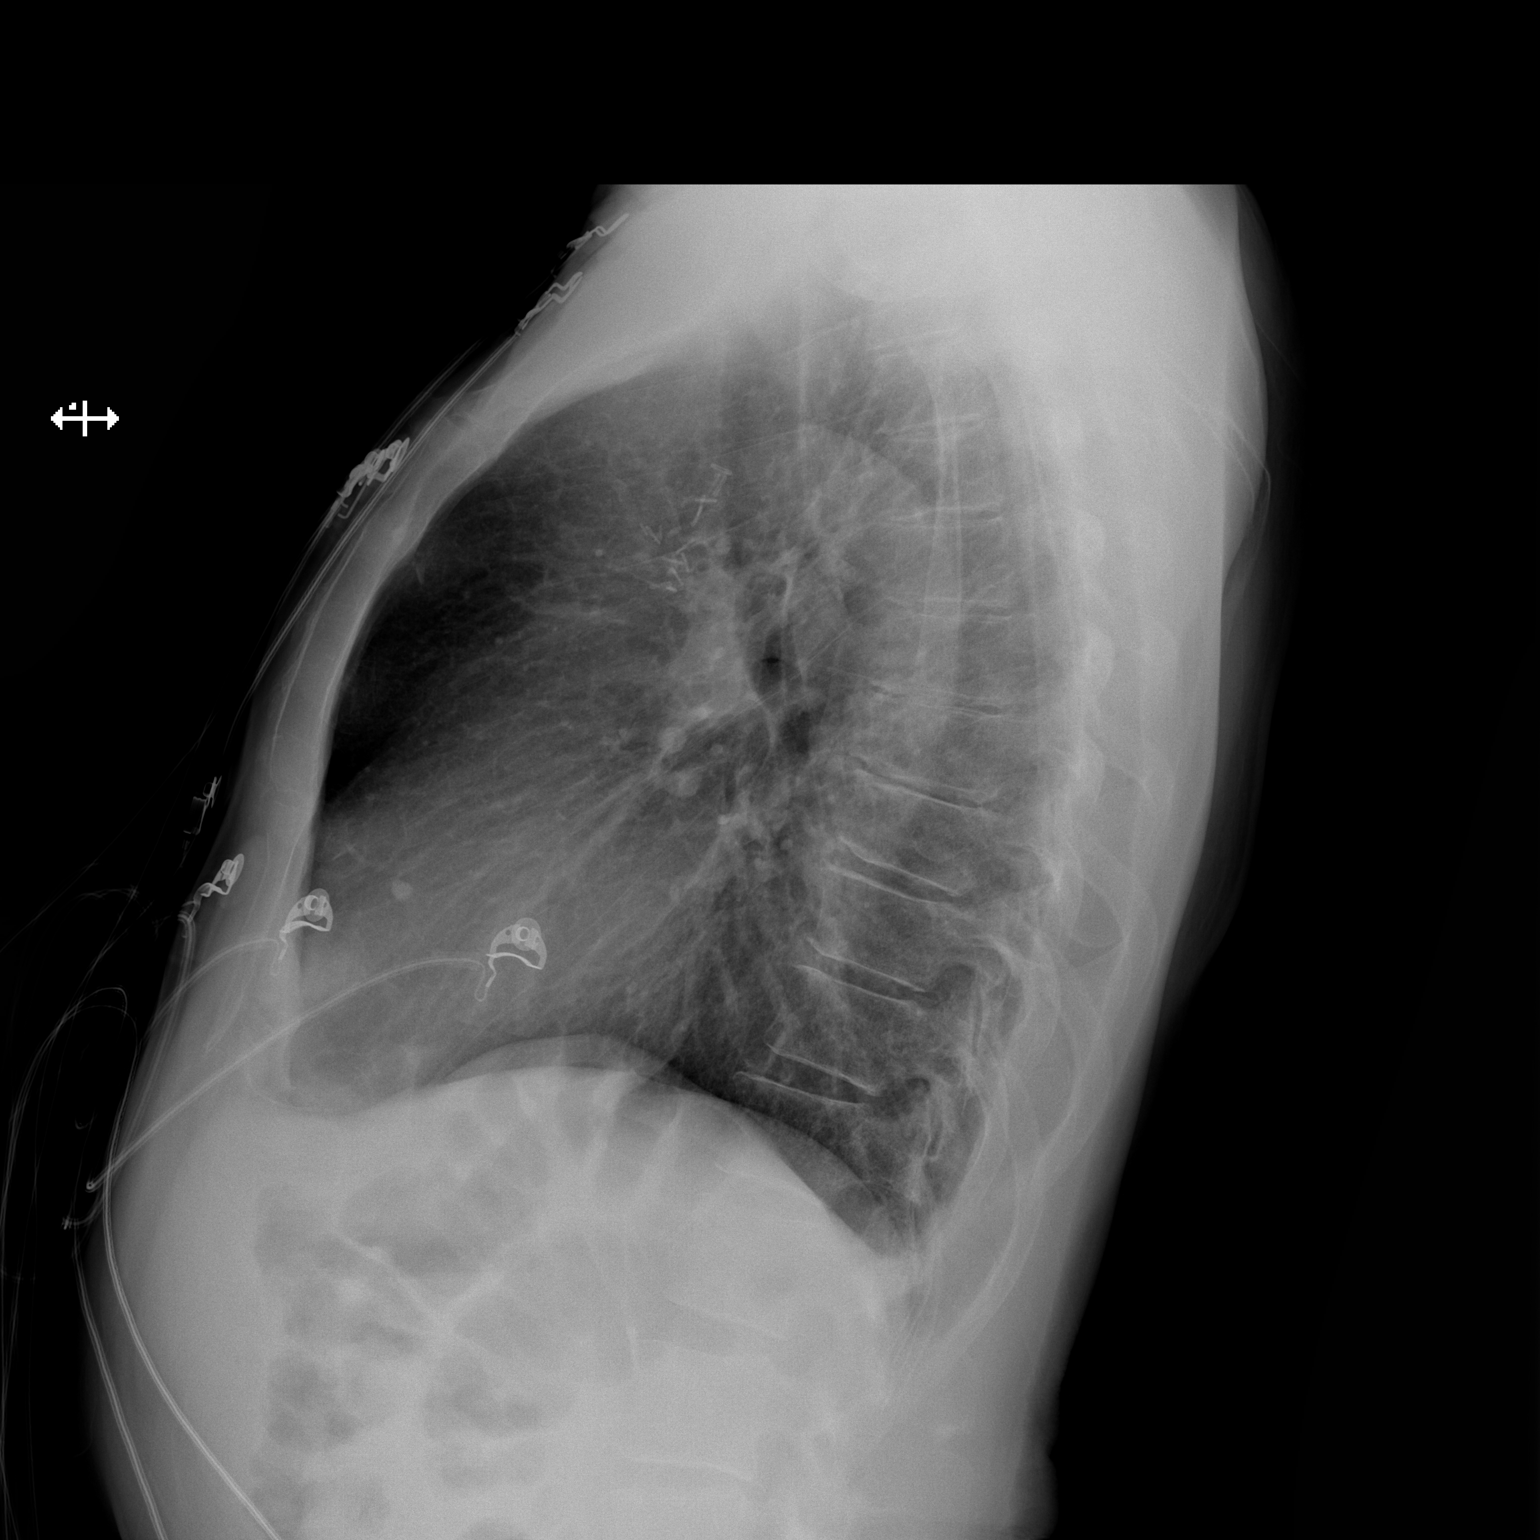

[2 of 2 positions shown; findings below may reference images not displayed]

FINDINGS: Mediastinum and hilar structures normal. Lungs are clear of acute
infiltrates. No pleural effusion or pneumothorax. Heart size normal.
Surgical clips right axilla. No acute bony abnormality.
IMPRESSION: No acute cardiopulmonary disease.
